# Patient Record
Sex: Female | Born: 1950 | Race: Black or African American | Hispanic: No | Marital: Single | Smoking: Former smoker
Health system: Southern US, Community
[De-identification: ages and names within clinical notes are randomized; demographics above are authoritative.]

## PROBLEM LIST (undated history)

## (undated) DIAGNOSIS — I2510 Atherosclerotic heart disease of native coronary artery without angina pectoris: Secondary | ICD-10-CM

## (undated) DIAGNOSIS — I10 Essential (primary) hypertension: Secondary | ICD-10-CM

## (undated) DIAGNOSIS — E119 Type 2 diabetes mellitus without complications: Secondary | ICD-10-CM

## (undated) DIAGNOSIS — E7800 Pure hypercholesterolemia, unspecified: Secondary | ICD-10-CM

## (undated) HISTORY — DX: Atherosclerotic heart disease of native coronary artery without angina pectoris: I25.10

## (undated) HISTORY — PX: CORONARY ANGIOPLASTY WITH STENT PLACEMENT: SHX49

## (undated) HISTORY — PX: ABDOMINAL HYSTERECTOMY: SHX81

---

## 2016-12-10 ENCOUNTER — Encounter (HOSPITAL_COMMUNITY): Payer: Self-pay | Admitting: Emergency Medicine

## 2016-12-10 ENCOUNTER — Ambulatory Visit (HOSPITAL_COMMUNITY): Payer: BLUE CROSS/BLUE SHIELD

## 2016-12-10 ENCOUNTER — Observation Stay (HOSPITAL_COMMUNITY)
Admission: EM | Admit: 2016-12-10 | Discharge: 2016-12-11 | Disposition: A | Discharge status: Home / Self Care | Payer: BLUE CROSS/BLUE SHIELD | Attending: Oncology | Admitting: Oncology

## 2016-12-10 ENCOUNTER — Ambulatory Visit (INDEPENDENT_AMBULATORY_CARE_PROVIDER_SITE_OTHER)
Admission: EM | Admit: 2016-12-10 | Discharge: 2016-12-10 | Disposition: A | Discharge status: Nursing Home (Includes ICF) | Payer: BLUE CROSS/BLUE SHIELD | Source: Home / Self Care | Attending: Family Medicine | Admitting: Family Medicine

## 2016-12-10 ENCOUNTER — Observation Stay (HOSPITAL_COMMUNITY): Payer: BLUE CROSS/BLUE SHIELD

## 2016-12-10 ENCOUNTER — Encounter (HOSPITAL_COMMUNITY): Payer: Self-pay | Admitting: *Deleted

## 2016-12-10 DIAGNOSIS — Z7982 Long term (current) use of aspirin: Secondary | ICD-10-CM | POA: Diagnosis not present

## 2016-12-10 DIAGNOSIS — E860 Dehydration: Secondary | ICD-10-CM | POA: Diagnosis not present

## 2016-12-10 DIAGNOSIS — I10 Essential (primary) hypertension: Secondary | ICD-10-CM | POA: Insufficient documentation

## 2016-12-10 DIAGNOSIS — R197 Diarrhea, unspecified: Secondary | ICD-10-CM

## 2016-12-10 DIAGNOSIS — E785 Hyperlipidemia, unspecified: Secondary | ICD-10-CM | POA: Diagnosis not present

## 2016-12-10 DIAGNOSIS — E7800 Pure hypercholesterolemia, unspecified: Secondary | ICD-10-CM | POA: Insufficient documentation

## 2016-12-10 DIAGNOSIS — I959 Hypotension, unspecified: Secondary | ICD-10-CM | POA: Diagnosis not present

## 2016-12-10 DIAGNOSIS — Z955 Presence of coronary angioplasty implant and graft: Secondary | ICD-10-CM | POA: Diagnosis not present

## 2016-12-10 DIAGNOSIS — E119 Type 2 diabetes mellitus without complications: Secondary | ICD-10-CM | POA: Insufficient documentation

## 2016-12-10 DIAGNOSIS — Z8269 Family history of other diseases of the musculoskeletal system and connective tissue: Secondary | ICD-10-CM

## 2016-12-10 DIAGNOSIS — J101 Influenza due to other identified influenza virus with other respiratory manifestations: Secondary | ICD-10-CM | POA: Diagnosis not present

## 2016-12-10 DIAGNOSIS — N179 Acute kidney failure, unspecified: Secondary | ICD-10-CM | POA: Diagnosis not present

## 2016-12-10 DIAGNOSIS — R112 Nausea with vomiting, unspecified: Secondary | ICD-10-CM

## 2016-12-10 DIAGNOSIS — Z79899 Other long term (current) drug therapy: Secondary | ICD-10-CM | POA: Insufficient documentation

## 2016-12-10 DIAGNOSIS — Z8249 Family history of ischemic heart disease and other diseases of the circulatory system: Secondary | ICD-10-CM | POA: Diagnosis not present

## 2016-12-10 DIAGNOSIS — I252 Old myocardial infarction: Secondary | ICD-10-CM | POA: Insufficient documentation

## 2016-12-10 DIAGNOSIS — N289 Disorder of kidney and ureter, unspecified: Secondary | ICD-10-CM | POA: Diagnosis not present

## 2016-12-10 DIAGNOSIS — R55 Syncope and collapse: Secondary | ICD-10-CM

## 2016-12-10 DIAGNOSIS — I2510 Atherosclerotic heart disease of native coronary artery without angina pectoris: Secondary | ICD-10-CM | POA: Diagnosis not present

## 2016-12-10 DIAGNOSIS — Z7984 Long term (current) use of oral hypoglycemic drugs: Secondary | ICD-10-CM

## 2016-12-10 HISTORY — DX: Type 2 diabetes mellitus without complications: E11.9

## 2016-12-10 HISTORY — DX: Essential (primary) hypertension: I10

## 2016-12-10 HISTORY — DX: Pure hypercholesterolemia, unspecified: E78.00

## 2016-12-10 LAB — URINALYSIS, ROUTINE W REFLEX MICROSCOPIC
Bilirubin Urine: NEGATIVE
GLUCOSE, UA: NEGATIVE mg/dL
Ketones, ur: NEGATIVE mg/dL
Nitrite: NEGATIVE
PROTEIN: 30 mg/dL — AB
Specific Gravity, Urine: 1.018 (ref 1.005–1.030)
pH: 5 (ref 5.0–8.0)

## 2016-12-10 LAB — CBC WITH DIFFERENTIAL/PLATELET
Basophils Absolute: 0 10*3/uL (ref 0.0–0.1)
Basophils Relative: 0 %
Eosinophils Absolute: 0.1 10*3/uL (ref 0.0–0.7)
Eosinophils Relative: 2 %
HEMATOCRIT: 39.6 % (ref 36.0–46.0)
HEMOGLOBIN: 12.6 g/dL (ref 12.0–15.0)
LYMPHS ABS: 2.3 10*3/uL (ref 0.7–4.0)
LYMPHS PCT: 26 %
MCH: 24 pg — AB (ref 26.0–34.0)
MCHC: 31.8 g/dL (ref 30.0–36.0)
MCV: 75.4 fL — AB (ref 78.0–100.0)
MONOS PCT: 11 %
Monocytes Absolute: 1 10*3/uL (ref 0.1–1.0)
NEUTROS ABS: 5.6 10*3/uL (ref 1.7–7.7)
Neutrophils Relative %: 61 %
Platelets: 279 10*3/uL (ref 150–400)
RBC: 5.25 MIL/uL — ABNORMAL HIGH (ref 3.87–5.11)
RDW: 17 % — ABNORMAL HIGH (ref 11.5–15.5)
WBC: 9 10*3/uL (ref 4.0–10.5)

## 2016-12-10 LAB — I-STAT CG4 LACTIC ACID, ED
LACTIC ACID, VENOUS: 1.95 mmol/L — AB (ref 0.5–1.9)
Lactic Acid, Venous: 2.57 mmol/L (ref 0.5–1.9)

## 2016-12-10 LAB — I-STAT CHEM 8, ED
BUN: 39 mg/dL — AB (ref 6–20)
CREATININE: 3.4 mg/dL — AB (ref 0.44–1.00)
Calcium, Ion: 1.11 mmol/L — ABNORMAL LOW (ref 1.15–1.40)
Chloride: 105 mmol/L (ref 101–111)
GLUCOSE: 94 mg/dL (ref 65–99)
HEMATOCRIT: 41 % (ref 36.0–46.0)
Hemoglobin: 13.9 g/dL (ref 12.0–15.0)
Potassium: 3.8 mmol/L (ref 3.5–5.1)
Sodium: 141 mmol/L (ref 135–145)
TCO2: 20 mmol/L (ref 0–100)

## 2016-12-10 LAB — GLUCOSE, CAPILLARY
GLUCOSE-CAPILLARY: 91 mg/dL (ref 65–99)
Glucose-Capillary: 114 mg/dL — ABNORMAL HIGH (ref 65–99)
Glucose-Capillary: 89 mg/dL (ref 65–99)

## 2016-12-10 LAB — I-STAT TROPONIN, ED: TROPONIN I, POC: 0.01 ng/mL (ref 0.00–0.08)

## 2016-12-10 LAB — HEPATIC FUNCTION PANEL
ALT: 12 U/L — ABNORMAL LOW (ref 14–54)
AST: 23 U/L (ref 15–41)
Albumin: 3.3 g/dL — ABNORMAL LOW (ref 3.5–5.0)
Alkaline Phosphatase: 52 U/L (ref 38–126)
BILIRUBIN TOTAL: 0.3 mg/dL (ref 0.3–1.2)
Total Protein: 7 g/dL (ref 6.5–8.1)

## 2016-12-10 LAB — D-DIMER, QUANTITATIVE (NOT AT ARMC): D DIMER QUANT: 0.47 ug{FEU}/mL (ref 0.00–0.50)

## 2016-12-10 LAB — INFLUENZA PANEL BY PCR (TYPE A & B)
Influenza A By PCR: POSITIVE — AB
Influenza B By PCR: NEGATIVE

## 2016-12-10 MED ORDER — ONDANSETRON 4 MG PO TBDP: 4.0000 mg | ORAL_TABLET | Freq: Once | ORAL | Status: DC

## 2016-12-10 MED ORDER — SODIUM CHLORIDE 0.9 % IV BOLUS (SEPSIS)
1000.0000 mL | Freq: Once | INTRAVENOUS | Status: AC
  Administered 2016-12-10: 1000 mL via INTRAVENOUS

## 2016-12-10 MED ORDER — POTASSIUM CHLORIDE IN NACL 20-0.9 MEQ/L-% IV SOLN
INTRAVENOUS | Status: AC
  Administered 2016-12-10 – 2016-12-11 (×2): via INTRAVENOUS
  Filled 2016-12-10 (×2): qty 1000

## 2016-12-10 MED ORDER — ROSUVASTATIN CALCIUM 40 MG PO TABS
40.0000 mg | ORAL_TABLET | Freq: Every day | ORAL | Status: DC
  Administered 2016-12-11: 40 mg via ORAL
  Filled 2016-12-10: qty 2
  Filled 2016-12-10: qty 1

## 2016-12-10 MED ORDER — ISOSORBIDE DINITRATE 30 MG PO TABS: 30.0000 mg | ORAL_TABLET | Freq: Every day | ORAL | Status: DC

## 2016-12-10 MED ORDER — ONDANSETRON HCL 4 MG/2ML IJ SOLN
INTRAMUSCULAR | Status: AC
  Filled 2016-12-10: qty 2

## 2016-12-10 MED ORDER — OSELTAMIVIR PHOSPHATE 30 MG PO CAPS
30.0000 mg | ORAL_CAPSULE | Freq: Every day | ORAL | Status: DC
  Administered 2016-12-10: 30 mg via ORAL
  Filled 2016-12-10: qty 1

## 2016-12-10 MED ORDER — SODIUM CHLORIDE 0.9 % IV BOLUS (SEPSIS): 1000.0000 mL | Freq: Once | INTRAVENOUS | Status: DC

## 2016-12-10 MED ORDER — HEPARIN SODIUM (PORCINE) 5000 UNIT/ML IJ SOLN
5000.0000 [IU] | Freq: Three times a day (TID) | INTRAMUSCULAR | Status: DC
  Administered 2016-12-10 – 2016-12-11 (×2): 5000 [IU] via SUBCUTANEOUS
  Filled 2016-12-10 (×2): qty 1

## 2016-12-10 MED ORDER — ASPIRIN 81 MG PO CHEW
81.0000 mg | CHEWABLE_TABLET | Freq: Every day | ORAL | Status: DC
  Administered 2016-12-11: 81 mg via ORAL
  Filled 2016-12-10: qty 1

## 2016-12-10 MED ORDER — INSULIN ASPART 100 UNIT/ML ~~LOC~~ SOLN: 0.0000 [IU] | Freq: Three times a day (TID) | SUBCUTANEOUS | Status: DC

## 2016-12-10 MED ORDER — ONDANSETRON HCL 4 MG/2ML IJ SOLN
4.0000 mg | Freq: Once | INTRAMUSCULAR | Status: AC
  Administered 2016-12-10: 4 mg via INTRAMUSCULAR

## 2016-12-10 NOTE — ED Triage Notes (Signed)
[facility] with a complaint of cough and chills x 2 days.

## 2016-12-10 NOTE — H&P (Signed)
Date: 12/10/2016               Patient Name:  Carrie Stevenson MRN: [id number]  DOB: 02-15-1951 Age / Sex: 66 y.o., female   PCP: No Pcp Per Patient         Medical Service: Internal Medicine Teaching Service         Attending Physician: Dr. Marily Memos, MD    First Contact: Dr. Reymundo Poll Pager: [phone]  Second Contact: Dr. Valentino Nose Pager: [phone]       After Hours (After 5p/  First Contact Pager: [phone]  weekends / holidays): Second Contact Pager: [phone]   Chief Complaint: Diarrhea   History of Present Illness: Patient is a 66 yo F with pmhx of HTN, DM, and CAD s/p stent placement (8 years ago) who presents with complaints of cough and diarrhea x 3 days. Patient was in her usual state of health until Saturday when she developed URI symptoms of cough and congestion. On Sunday, patient developed diarrhea, body aches, and poor appetite. She reports 2-3 episodes of watery diarrhea a day and fevers up to 102 at home. She denies sick contacts, but did just return from a trip to [place]. Her return flight home was Friday, the day before she began to feel ill. She denies eating any usual or uncooked foods while on her trip. She initially presented to an urgent care. While in triage, she suddenly became weak and had a witnessed syncopal episode. SBP during this time was recorded as 50 and she was noted to have AMS. This episode was brief and she returned to baseline quickly. She subsequently had a witnessed episode of emesis and was transferred to [facility] ED for further evaluation.   On arrival to [facility], she was afebrile T 97.5 but hypotensive 81/59 with HR 98 and RR 20. She was given 2L NS with good response in her BP to 124/80. CBC was unremarkable. BMP was notable for elevated creatinine of 3.4 (no priors for comparison). Lactic acid was elevated 2.57. CXR was ordered and pending.   Meds:  Current Meds  Medication Sig  . aspirin 81 MG chewable tablet Chew 81 mg by mouth  daily.   Marland Kitchen guaiFENesin (MUCINEX) 600 MG 12 hr tablet Take 600 mg by mouth 2 (two) times daily as needed for cough.  . isosorbide dinitrate (ISORDIL) 30 MG tablet Take 30 mg by mouth daily.   Marland Kitchen lisinopril (PRINIVIL,ZESTRIL) 10 MG tablet Take 10 mg by mouth daily.  . metFORMIN (GLUCOPHAGE) 1000 MG tablet Take 1,000 mg by mouth 2 (two) times daily with a meal.  . rosuvastatin (CRESTOR) 40 MG tablet Take 40 mg by mouth daily.     Allergies: Allergies as of 12/10/2016  . (No Known Allergies)   Past Medical History:  Diagnosis Date  . Diabetes mellitus without complication (HCC)   . Hypercholesteremia   . Hypertension     Family History: CAD in all three sisters. ?Cancer in sister and grandmother. Lupus in mother, 2 sisters, and brother.   Social History: Recently moved back to [place] from [place]. Denies tobacco, alcohol, and illicit drug use.   Review of Systems: A complete ROS was negative except as per HPI.   Physical Exam: Blood pressure 124/80, pulse 92, temperature 97.8 F (36.6 C), temperature source Oral, resp. rate 21, SpO2 96 %. Constitutional: Laying in bed under multiple blankets, appears comfortable HEENT: Atraumatic, normocephalic. PERRL, anicteric sclera.  Neck: Supple, trachea midline.  Cardiovascular: Distant heart sounds but RRR, no murmurs, rubs, or gallops.  Pulmonary/Chest: CTAB, Mild expiratory wheezing, rales, or rhonchi. No chest wall abnormalities.  Abdominal: Soft, non tender, non distended. +BS.  Extremities: Warm and well perfused. Distal pulses intact. No edema.  Neurological: A&Ox3, CN II - XII grossly intact.  Skin: No rashes or erythema  Psychiatric: Normal mood and affect  EKG: Personally reviewed. Normal sinus rhythm. Q waves in II, III, aVF, V4, V5, V6  CXR: Pending   Assessment & Plan by Problem:  Patient is a 66 yo F with pmhx of HTN, DM, and CAD s/p stent (8 years ago) who presents with symptoms of URI and diarrhea x 3 days, admitted  after a syncopal episode and found to be hypotensive in the ED.   Syncope: Likely due to dehydration from a viral illness. Presentation is concerning for influenza vs. Gastroenteritis. Patient was hypotensive in the ED and BP responded well to fluids. She reports diarrhea x 3 days, very poor po intake, and one episode of witnessed emesis in the ED. She first developed symptoms of cough and congestion on Saturday, the day after [place]. She subsequently developed body aches, fevers up to 102, and watery diarrhea.  -- Supportive care -- S/p 2 L NS in ED; continue NS @ 125 -- Diet as tolerated -- Influenza panel pending  -- Droplet precautions   Renal Dysfuntion: Creatinine 3.4, no prior labs for comparison. Unclear baseline or chronicity, but suspect some component of acute on chronic due to viral illness and poor po intake. -- NS @ 125  -- Recheck AM labs    DM: -- Hold home metformin -- SSI   HTN:  -- Hold home lisinopril, isosorbide, carvedilol   HLD: -- Continue Crestor 40 mg daily   CAD: -- Continue ASA 81 daily   FEN: NS @ 125 cc/hr, replete lytes prn, Diet as tolerated VTE ppx: Sub q heparin  Code Status: FULL   Dispo: Admit patient to Observation with expected length of stay less than 2 midnights.  Signed: Reymundo Pollarolyn Sandeep Delagarza, MD 12/10/2016, 3:21 PM  Pager: [phone]

## 2016-12-10 NOTE — Progress Notes (Signed)
Carrie Stevenson [id number] Admission Data: 12/10/2016 5:41 PM Attending Provider: Levert FeinsteinJames M Granfortuna, MD  PCP:No PCP Per Patient Consults/ Treatment Team:   Carrie Stevenson is a 66 y.o. female patient admitted from ED awake, alert  & orientated  X 3,  Full Code, VSS - Blood pressure (!) 150/80, pulse 94, temperature 98.3 F (36.8 C), temperature source Oral, resp. rate 17, SpO2 94 %., , no c/o shortness of breath, no c/o chest pain, no distress noted.   IV site WDL:  hand right, condition patent and no redness with a transparent dsg that's clean dry and intact.  Allergies:  No Known Allergies   Past Medical History:  Diagnosis Date  . Diabetes mellitus without complication (HCC)   . Hypercholesteremia   . Hypertension     History:  obtained from chart review. Tobacco/alcohol: denied none  Pt orientation to unit, room and routine. Information packet given to patient/family and safety video watched.  Admission INP armband ID verified with patient/family, and in place. SR up x 2, fall risk assessment complete with Patient and family verbalizing understanding of risks associated with falls. Pt verbalizes an understanding of how to use the call bell and to call for help before getting out of bed.  Skin, clean-dry- intact without evidence of bruising, or skin tears.   No evidence of skin break down noted on exam. color normal, vascularity normal, no rashes or suspicious lesions, no evidence of bleeding or bruising, no lesions noted, no rash, no edema, temperature normal, texture normal, mobility and turgor normal, nails normal without clubbing    Will cont to monitor and assist as needed.  Camillo FlamingVicki L Khy Pitre, RN 12/10/2016 5:41 PM

## 2016-12-10 NOTE — ED Triage Notes (Signed)
Per [facility] [place] [place]. Pt reports generalized weakness, diarrhea, and productive cough. Pt was found to be hypotensive with EMS. Pt received 500ml NS en route

## 2016-12-10 NOTE — ED Notes (Signed)
Transferred to stretcher, transferred without incident.  Prior to transfer, patient vomited small/moderate dark watery liquid.  Patient diaphoretic.

## 2016-12-10 NOTE — ED Notes (Signed)
Patient acuity reported to T. Bast, RN. 

## 2016-12-10 NOTE — ED Provider Notes (Signed)
CSN: [id number]     Arrival date & time 12/10/16  1141 History   None    Chief Complaint  Patient presents with  . Cough   (Consider location/radiation/quality/duration/timing/severity/associated sxs/prior Treatment) 66 year old female patient brought to clinic by her husband with a chief complaint of nausea, vomiting, and diarrhea for 2-3 days has not had anything to eat in 2-3 days due to her symptoms she is a diabetic and taking metformin. She reports she has vomited 2-3 times this morning and also had 4-5 loose stools. She complains of weakness but denies chest pain or shortness of breath. She has had no swelling in her hands feet or ankles, she denies any heart palpitations, she does have a past history of heart problems having a stent placed several years ago.  For family history, she reports her mother died at age 64 of lupus, she reports her father died at the age of 64-32 of an accident. Multiple family members have history of diabetes, hypertension, high cholesterol, and stroke. She reports she had one sister died at the age of 64 due to heart attack.  Patient became weak in triage, had syncopal episode witnessed by nursing staff. Nursing staff reports she had systolic blood pressure of 50, along with altered mental status. Patient was actively vomiting in triage, vomiting. Clear without bile, or evidence of hemorrhage.   The history is provided by the spouse.  Cough    Past Medical History:  Diagnosis Date  . Diabetes mellitus without complication (HCC)   . Hypercholesteremia   . Hypertension    Past Surgical History:  Procedure Laterality Date  . ABDOMINAL HYSTERECTOMY    . CORONARY ANGIOPLASTY WITH STENT PLACEMENT     History reviewed. No pertinent family history. Social History  Substance Use Topics  . Smoking status: Never Smoker  . Smokeless tobacco: Never Used  . Alcohol use No   OB History    No data available     Review of Systems  Reason unable to perform  ROS: as covered in HPI.  Respiratory: Positive for cough.   All other systems reviewed and are negative.   Allergies  Patient has no known allergies.  Home Medications   Prior to Admission medications   Medication Sig Start Date End Date Taking? Authorizing Provider  aspirin 81 MG chewable tablet Chew by mouth daily.   Yes Historical Provider, MD  isosorbide dinitrate (ISORDIL) 30 MG tablet Take 30 mg by mouth 4 (four) times daily.   Yes Historical Provider, MD  lisinopril (PRINIVIL,ZESTRIL) 10 MG tablet Take 10 mg by mouth daily.   Yes Historical Provider, MD  rosuvastatin (CRESTOR) 40 MG tablet Take 40 mg by mouth daily.   Yes Historical Provider, MD   Meds Ordered and Administered this Visit   Medications  sodium chloride 0.9 % bolus 1,000 mL (1,000 mLs Intravenous Given 12/10/16 1308)  ondansetron (ZOFRAN) injection 4 mg (4 mg Intramuscular Given 12/10/16 1309)    BP (!) 79/52   Pulse 92   Temp 97.5 F (36.4 C) (Oral)   Resp 18   LMP  (LMP Unknown)   SpO2 97%  No data found.   Physical Exam  Constitutional: She is oriented to person, place, and time. She appears well-developed and well-nourished. She appears lethargic. She has a sickly appearance. She appears ill. She appears distressed.  HENT:  Head: Normocephalic.  Right Ear: External ear normal.  Left Ear: External ear normal.  Eyes: EOM are normal. Pupils are equal, round,  and reactive to light.  Neck: Normal range of motion. No JVD present.  Cardiovascular: Normal rate, regular rhythm, S1 normal, S2 normal, normal heart sounds, intact distal pulses and normal pulses.   Pulmonary/Chest: Effort normal. She has decreased breath sounds in the left lower field. She has rhonchi in the right lower field and the left lower field.  Abdominal: Soft. Bowel sounds are normal.  Lymphadenopathy:       Head (right side): No submental, no submandibular and no tonsillar adenopathy present.       Head (left side): No submental, no  submandibular and no tonsillar adenopathy present.    She has no cervical adenopathy.  Neurological: She is oriented to person, place, and time. She appears lethargic.  Skin: Skin is warm. Capillary refill takes less than 2 seconds. She is diaphoretic. There is pallor.  Psychiatric: She has a normal mood and affect.  Nursing note and vitals reviewed.   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   (including critical care time)  Labs Review Labs Reviewed  GLUCOSE, CAPILLARY - Abnormal; Notable for the following:       Result Value   Glucose-Capillary 114 (*)    All other components within normal limits    Imaging Review No results found.   Visual Acuity Review  Right Eye Distance:   Left Eye Distance:   Bilateral Distance:    Right Eye Near:   Left Eye Near:    Bilateral Near:         MDM   1. Syncope and collapse   2. Hypotension, unspecified hypotension type   3. Nausea vomiting and diarrhea    [facility] for syncopal episode with hypotension. Patient has IV established, had 4 mg of Zofran IV push, 1 L normal saline, cardiac monitoring, and 12-lead EKG showing no acute changes, however evidence of past lateral MI.      Dorena Bodo, NP 12/10/16 1339

## 2016-12-10 NOTE — ED Provider Notes (Signed)
[facility]-EMERGENCY DEPT Provider Note   CSN: [id number] Arrival date & time: 12/10/16  1338     History   Chief Complaint Chief Complaint  Patient presents with  . Hypotension    HPI Carrie Stevenson is a 66 y.o. female.  HPI  Sent from clinic after syncopal episode - went for myalgias, N/V/D, cough, sore throat While sitting, syncopized Per EMS, had large blood cuff so perhaps not so hypotensive No cp/sob Has had an mi in past, with stent - however, sx not like this Was recently in a long trip but no hx dvt/pe, recent surgery States feels much better now - got 1L fluids No rectal bleeding, hematemesis, coffee ground emesis No vaginal bleeding, not pregnant or able to be pregnant  Past Medical History:  Diagnosis Date  . Diabetes mellitus without complication (HCC)   . Hypercholesteremia   . Hypertension     Patient Active Problem List   Diagnosis Date Noted  . Diabetes mellitus without complication (HCC) 12/10/2016  . Hypertension 12/10/2016  . Flu-like symptoms 12/10/2016  . AKI (acute kidney injury) (HCC) 12/10/2016    Past Surgical History:  Procedure Laterality Date  . ABDOMINAL HYSTERECTOMY    . CORONARY ANGIOPLASTY WITH STENT PLACEMENT      OB History    No data available       Home Medications    Prior to Admission medications   Medication Sig Start Date End Date Taking? Authorizing Provider  aspirin 81 MG chewable tablet Chew 81 mg by mouth daily.    Yes Historical Provider, MD  guaiFENesin (MUCINEX) 600 MG 12 hr tablet Take 600 mg by mouth 2 (two) times daily as needed for cough.   Yes Historical Provider, MD  isosorbide dinitrate (ISORDIL) 30 MG tablet Take 30 mg by mouth daily.    Yes Historical Provider, MD  lisinopril (PRINIVIL,ZESTRIL) 10 MG tablet Take 10 mg by mouth daily.   Yes Historical Provider, MD  metFORMIN (GLUCOPHAGE) 1000 MG tablet Take 1,000 mg by mouth 2 (two) times daily with a meal.   Yes Historical Provider, MD  rosuvastatin  (CRESTOR) 40 MG tablet Take 40 mg by mouth daily.   Yes Historical Provider, MD    Family History No family history on file.  Social History Social History  Substance Use Topics  . Smoking status: Never Smoker  . Smokeless tobacco: Never Used  . Alcohol use No     Allergies   Patient has no known allergies.   Review of Systems Review of Systems  Constitutional: Positive for chills and fatigue.  Allergic/Immunologic: Negative for immunocompromised state.  All other systems reviewed and are negative.    Physical Exam Updated Vital Signs BP (!) 150/80 (BP Location: Right Arm)   Pulse 94   Temp 98.3 F (36.8 C) (Oral)   Resp 17   LMP  (LMP Unknown)   SpO2 94%   Physical Exam  Constitutional: She appears well-developed and well-nourished. No distress.  HENT:  Head: Normocephalic and atraumatic.  Eyes: Conjunctivae are normal.  Neck: Neck supple.  Cardiovascular: Normal rate and regular rhythm.   No murmur heard. Pulmonary/Chest: Effort normal and breath sounds normal. No respiratory distress.  Abdominal: Soft. Bowel sounds are normal. She exhibits no distension and no mass. There is no tenderness. There is no rebound and no guarding. No hernia.  Musculoskeletal: She exhibits no edema.  Neurological: She is alert.  Skin: Skin is warm and dry.  Psychiatric: She has a normal mood and  affect.  Nursing note and vitals reviewed.    ED Treatments / Results  Labs (all labs ordered are listed, but only abnormal results are displayed) Labs Reviewed  CBC WITH DIFFERENTIAL/PLATELET - Abnormal; Notable for the following:       Result Value   RBC 5.25 (*)    MCV 75.4 (*)    MCH 24.0 (*)    RDW 17.0 (*)    All other components within normal limits  HEPATIC FUNCTION PANEL - Abnormal; Notable for the following:    Albumin 3.3 (*)    ALT 12 (*)    Bilirubin, Direct <0.1 (*)    All other components within normal limits  I-STAT CG4 LACTIC ACID, ED - Abnormal; Notable for  the following:    Lactic Acid, Venous 2.57 (*)    All other components within normal limits  I-STAT CHEM 8, ED - Abnormal; Notable for the following:    BUN 39 (*)    Creatinine, Ser 3.40 (*)    Calcium, Ion 1.11 (*)    All other components within normal limits  I-STAT CG4 LACTIC ACID, ED - Abnormal; Notable for the following:    Lactic Acid, Venous 1.95 (*)    All other components within normal limits  D-DIMER, QUANTITATIVE (NOT AT [facility])  URINALYSIS, ROUTINE W REFLEX MICROSCOPIC  INFLUENZA PANEL BY PCR (TYPE A & B)  HIV ANTIBODY (ROUTINE TESTING)  BASIC METABOLIC PANEL  GLUCOSE, CAPILLARY  I-STAT TROPOININ, ED  I-STAT CG4 LACTIC ACID, ED    EKG  EKG Interpretation  Date/Time:  Tuesday December 10 2016 14:15:03 EST Ventricular Rate:  86 PR Interval:  150 QRS Duration: 84 QT Interval:  402 QTC Calculation: 481 R Axis:   -8 Text Interpretation:  Normal sinus rhythm Inferior infarct , age undetermined Anterolateral infarct , age undetermined Abnormal ECG No old tracing to compare Confirmed by [facility] MD, Barbara CowerJASON [phone]([id number]) on 12/10/2016 2:58:39 PM       Radiology No results found.  Procedures Procedures (including critical care time)  Medications Ordered in ED Medications  rosuvastatin (CRESTOR) tablet 40 mg (not administered)  aspirin chewable tablet 81 mg (not administered)  heparin injection 5,000 Units (not administered)  0.9 % NaCl with KCl 20 mEq/ L  infusion (not administered)  insulin aspart (novoLOG) injection 0-9 Units (not administered)  sodium chloride 0.9 % bolus 1,000 mL (0 mLs Intravenous Stopped 12/10/16 1611)  sodium chloride 0.9 % bolus 1,000 mL (0 mLs Intravenous Stopped 12/10/16 1612)     Initial Impression / Assessment and Plan / ED Course  I have reviewed the triage vital signs and the nursing notes.  Pertinent labs & imaging results that were available during my care of the patient were reviewed by me and considered in my medical decision making  (see chart for details).     New AKI suspected, dehydration - fluids given, decreasing LA Doubt PE, low risk, d-dimer negative EKG with qw - not likely ACS - hx old MI but no EKG to compare to; trop negative Labs otherwise reassuring - suspect viral etiology, abdomen benign - doubt acute abdomen, AAA Pending influenza test Pt updated and in agreement Discussed with inpt team who agrees to admit   Final Clinical Impressions(s) / ED Diagnoses   Final diagnoses:  AKI (acute kidney injury) [facility](HCC)    New Prescriptions Current Discharge Medication List       Sidney AceAlison Charruf Undray Allman, MD 12/10/16 1713    Marily MemosJason Mesner, MD 12/11/16 1157

## 2016-12-10 NOTE — ED Notes (Signed)
spoke to justin, rn in [facility]

## 2016-12-10 NOTE — ED Notes (Signed)
[facility] notified 

## 2016-12-10 NOTE — ED Notes (Addendum)
[facility] at bedside. Report given to ED.

## 2016-12-10 NOTE — Discharge Instructions (Signed)
[facility] for syncopal episode with hypotension. Patient has IV established, had 4 mg of Zofran IV push, 1 L normal saline, cardiac monitoring, and 12-lead EKG showing no acute changes, however evidence of past lateral MI.

## 2016-12-10 NOTE — ED Notes (Signed)
Went to recheck patients BP and checked twice. Pt BP was 51/60. Pt was pale, diaphoretic and cool and clammy. Pt vomited twice. Pt taken to a bed and IV and fluids started. Pt never lost consciousness. Pt currently alert with family at bedside. [facility] called for transport to ED>

## 2016-12-11 ENCOUNTER — Telehealth: Payer: Self-pay | Admitting: Internal Medicine

## 2016-12-11 DIAGNOSIS — N179 Acute kidney failure, unspecified: Secondary | ICD-10-CM | POA: Diagnosis not present

## 2016-12-11 DIAGNOSIS — J101 Influenza due to other identified influenza virus with other respiratory manifestations: Secondary | ICD-10-CM | POA: Diagnosis not present

## 2016-12-11 DIAGNOSIS — B9789 Other viral agents as the cause of diseases classified elsewhere: Secondary | ICD-10-CM

## 2016-12-11 LAB — BASIC METABOLIC PANEL
Anion gap: 10 (ref 5–15)
BUN: 23 mg/dL — ABNORMAL HIGH (ref 6–20)
CALCIUM: 8.7 mg/dL — AB (ref 8.9–10.3)
CO2: 21 mmol/L — ABNORMAL LOW (ref 22–32)
CREATININE: 1.57 mg/dL — AB (ref 0.44–1.00)
Chloride: 108 mmol/L (ref 101–111)
GFR calc Af Amer: 39 mL/min — ABNORMAL LOW (ref >60)
GFR, EST NON AFRICAN AMERICAN: 34 mL/min — AB (ref >60)
GLUCOSE: 88 mg/dL (ref 65–99)
Potassium: 3.9 mmol/L (ref 3.5–5.1)
SODIUM: 139 mmol/L (ref 135–145)

## 2016-12-11 LAB — GLUCOSE, CAPILLARY: GLUCOSE-CAPILLARY: 91 mg/dL (ref 65–99)

## 2016-12-11 LAB — HIV ANTIBODY (ROUTINE TESTING W REFLEX): HIV SCREEN 4TH GENERATION: NONREACTIVE

## 2016-12-11 MED ORDER — OSELTAMIVIR PHOSPHATE 30 MG PO CAPS
30.0000 mg | ORAL_CAPSULE | Freq: Two times a day (BID) | ORAL | Status: DC
  Administered 2016-12-11: 30 mg via ORAL
  Filled 2016-12-11: qty 1

## 2016-12-11 MED ORDER — OSELTAMIVIR PHOSPHATE 30 MG PO CAPS: 30.0000 mg | ORAL_CAPSULE | Freq: Two times a day (BID) | ORAL | 0 refills | Status: DC

## 2016-12-11 NOTE — Discharge Summary (Signed)
Name: Carrie Stevenson MRN: [id number] DOB: [birth year] 66 y.o. PCP: No Pcp Per Patient  Date of Admission: 12/10/2016  1:38 PM Date of Discharge: 12/11/2016 Attending Physician: Levert Feinstein, MD  Discharge Diagnosis: 1. Influenza A 2. AKI   Discharge Medications: Allergies as of 12/11/2016   No Known Allergies     Medication List    TAKE these medications   aspirin 81 MG chewable tablet Chew 81 mg by mouth daily.   guaiFENesin 600 MG 12 hr tablet Commonly known as:  MUCINEX Take 600 mg by mouth 2 (two) times daily as needed for cough.   isosorbide dinitrate 30 MG tablet Commonly known as:  ISORDIL Take 30 mg by mouth daily.   lisinopril 10 MG tablet Commonly known as:  PRINIVIL,ZESTRIL Take 10 mg by mouth daily.   metFORMIN 1000 MG tablet Commonly known as:  GLUCOPHAGE Take 1,000 mg by mouth 2 (two) times daily with a meal.   oseltamivir 30 MG capsule Commonly known as:  TAMIFLU Take 1 capsule (30 mg total) by mouth 2 (two) times daily.   rosuvastatin 40 MG tablet Commonly known as:  CRESTOR Take 40 mg by mouth daily.       Disposition and follow-up:   [name] was discharged from [facility] in Stable condition.  At the hospital follow up visit please address:  1.  Influenza A: Discharged with prescription to complete 5 day course of Tamiflu (renally dosed due to AKI). Last day 12/15/16.   2. AKI: Likely prerenal due to diarrhea and poor po intake on admission. Home lisinopril were held. Creatinine improved with fluids 3.4 -> 1.5. Please recheck BMP. Lisinopril restarted on discharge.   3.  Labs / imaging needed at time of follow-up: BMP  4.  Pending labs/ test needing follow-up: none   Follow-up Appointments:   Hospital Course by problem list:  1. Influenza A Infection: Patient is a 66 yo F with pmhx of HTN, DM, and CAD s/p stent placement (8 years ago) who presented with complaints of cough and diarrhea x 3 days.  Patient flew back from [facility] the Friday prior to admission and began to feel ill the following day. She endorsed symptoms of URI (cough and congestion). On Sunday, she developed body aches, diarrhea, and fevers up to 102 at home. She was unable to tolerate any PO due to poor appetite and she presented to urget care on Tuesday morning. While in triage, she had a witnessed syncopal episode and was noted to have AMS. Systolic blood pressure during this episode was recorded to be in the 50s. She recovered quickly back to her baseline but then had a witnessed episode of emesis. She was then transferred to [facility] for further evaluation. On presentation,  she was afebrile T 97.5 but hypotensive 81/59 with HR 98 and RR 20. She was given 2L NS with good response in her BP to 124/80. CBC was unremarkable. BMP was notable for elevated creatinine of 3.4 (no priors for comparison). Lactic acid was elevated 2.57. CXR was negative. Influenza PCR ultimately resulted positive for flu A. She was started on renally dosed Tamiflu (30 mg BID) due to her AKI and received NS @ 125 overnight. The following morning, her diarrhea had resolved and she was ambulating around the room without difficulty. She was tolerating some PO without emesis. Hypotension had resolved. She was discharged with a prescription to complete a 5 day course of Tamiflu.   2. Acute Kidney  Injury: Likely prerenal due to volume depletion. Patient reported poor po intake x 2-3 days and multiple episodes of diarrhea a day. Creatinine improved with IVFs, 3.4 ->1.5. Lisinopril was held on admission but restarted at discharge. Please recheck BMP at follow up.  Discharge Vitals:   BP 129/85   Pulse 95   Temp 98.7 F (37.1 C)   Resp 18   Ht 5\' 3"  (1.6 m)   Wt 190 lb 12.8 oz (86.5 kg)   LMP  (LMP Unknown)   SpO2 100%   BMI 33.80 kg/m   Pertinent Labs, Studies, and Procedures:    Ref. Range 12/10/2016 14:54 12/11/2016 07:17  Sodium Latest Ref Range: 135  - 145 mmol/L 141 139  Potassium Latest Ref Range: 3.5 - 5.1 mmol/L 3.8 3.9  Chloride Latest Ref Range: 101 - 111 mmol/L 105 108  CO2 Latest Ref Range: 22 - 32 mmol/L  21 (L)  Glucose Latest Ref Range: 65 - 99 mg/dL 94 88  BUN Latest Ref Range: 6 - 20 mg/dL 39 (H) 23 (H)  Creatinine Latest Ref Range: 0.44 - 1.00 mg/dL 2.723.40 (H) 5.361.57 (H)    Ref. Range 12/10/2016 16:54  Influenza A By PCR Latest Ref Range: NEGATIVE  POSITIVE (A)  Influenza B By PCR Latest Ref Range: NEGATIVE  NEGATIVE    Discharge Instructions: Discharge Instructions    Call MD for:  difficulty breathing, headache or visual disturbances    Complete by:  As directed    Call MD for:  extreme fatigue    Complete by:  As directed    Call MD for:  persistant dizziness or light-headedness    Complete by:  As directed    Call MD for:  persistant nausea and vomiting    Complete by:  As directed    Call MD for:  temperature >100.4    Complete by:  As directed    Diet - low sodium heart healthy    Complete by:  As directed    Discharge instructions    Complete by:  As directed    Carrie Stevenson, I am glad you are feeling better. It was a pleasure taking care of you! For your influenza infection, please continue to take tamiflu twice a day for the next 4 days. A prescription has been sent to your pharmacy. You have been scheduled to follow up in our clinic next Wednesday the 28th at 1:45pm for hospital follow up. You will need to have blood work done at that visit to check on your kidney function. Our clinic is located on the ground floor of this hospital in the east wing past the cafeteria. If you have any questions or concerns, call our clinic at [phone] or after hours call [phone] and ask for the internal medicine resident on call. Thank you!   Increase activity slowly    Complete by:  As directed       Signed: Reymundo Pollarolyn Soumya Colson, MD 12/11/2016, 11:00 AM   Pager: [phone]

## 2016-12-11 NOTE — Progress Notes (Signed)
Kerrie PleasureArcine Buchan to be D/C'd Home per MD order.  Discussed with the patient and all questions fully answered.  VSS, Skin clean, dry and intact without evidence of skin break down, no evidence of skin tears noted. IV catheter discontinued intact. Site without signs and symptoms of complications. Dressing and pressure applied.  An After Visit Summary was printed and given to the patient. Patient received prescription.  D/c education completed with patient/family including follow up instructions, medication list, d/c activities limitations if indicated, with other d/c instructions as indicated by MD - patient able to verbalize understanding, all questions fully answered.   Patient instructed to return to ED, call 911, or call MD for any changes in condition.   Patient escorted via WC, and D/C home via private auto.  Grayling Congressvan J Karmel Patricelli 12/11/2016 12:12 PM

## 2016-12-11 NOTE — Telephone Encounter (Signed)
Pt needs TOC discharge date 12/11/16 HFU 12/18/16

## 2016-12-11 NOTE — Progress Notes (Signed)
   Subjective: Patient is feeling better today. She is no longer having diarrhea. She is tolerating minimal PO and ambulating around the room without issue. Eager for discharge.   Objective:  Vital signs in last 24 hours: Vitals:   12/10/16 2001 12/10/16 2003 12/10/16 2004 12/10/16 2005  BP: 135/77 125/79 120/79 133/81  Pulse: 97 (!) 104 (!) 110 (!) 110  Resp: 19 19 19 19   Temp: 98.2 F (36.8 C)     TempSrc: Oral     SpO2: 97% 95% 95% 96%  Weight:      Height:       Physical Exam Constitutional: NAD, appears comfortable Cardiovascular: RRR, no murmurs, rubs, or gallops.  Pulmonary/Chest: CTAB, no wheezes, rales, or rhonchi.  Abdominal: Soft, non tender, non distended. +BS.  Extremities: Warm and well perfused. Distal pulses intact. No edema.  Neurological: A&Ox3, CN II - XII grossly intact.    Assessment/Plan:  Patient is a 66 yo F with pmhx of HTN, DM, and CAD s/p stent (8 years ago) who presents with symptoms of URI and diarrhea x 3 days, admitted after a syncopal episode and found to be hypotensive in the ED.   Influenza A Infection: Patient presented with symptoms of cough and diarrhea x 3 days. She was hypotensive with AKI on admission and had a witnessed syncopal episode in triage. She is s/p 2L fluid bolus in the ED and NS @ 125 over night. This morning her BP has significantly improved. She was started on renally dosed tamiflu. She denies any further episode of diarrhea.  -- Discharge today -- Tamiflu BID to complete a 5 day course   AKI: Significantly improved this morning after fluids. Creatinine 3.4 --> 1.5.  -- F/u IMC 1 week for BMP recheck   DM: -- Hold home metformin -- SSI   HTN:  -- Restart home lisinopril, isosorbide, carvedilol on discharge   HLD: -- Continue Crestor 40 mg daily   CAD: -- Continue ASA 81 daily   FEN: Fluid stopped, replete lytes prn, Diet as tolerated VTE ppx: Sub q heparin  Code Status: FULL   Dispo: Anticipated  discharge today.   Carrie Pollarolyn Sherolyn Trettin, MD 12/11/2016, 9:37 AM Pager: [phone]

## 2016-12-18 ENCOUNTER — Ambulatory Visit (INDEPENDENT_AMBULATORY_CARE_PROVIDER_SITE_OTHER): Payer: BLUE CROSS/BLUE SHIELD | Admitting: Pulmonary Disease

## 2016-12-18 ENCOUNTER — Ambulatory Visit: Payer: BLUE CROSS/BLUE SHIELD

## 2016-12-18 VITALS — BP 155/93 | HR 82 | Temp 98.1°F | Ht 63.0 in | Wt 196.2 lb

## 2016-12-18 DIAGNOSIS — Z5189 Encounter for other specified aftercare: Secondary | ICD-10-CM

## 2016-12-18 DIAGNOSIS — Z79899 Other long term (current) drug therapy: Secondary | ICD-10-CM | POA: Diagnosis not present

## 2016-12-18 DIAGNOSIS — E119 Type 2 diabetes mellitus without complications: Secondary | ICD-10-CM

## 2016-12-18 DIAGNOSIS — N179 Acute kidney failure, unspecified: Secondary | ICD-10-CM | POA: Diagnosis not present

## 2016-12-18 DIAGNOSIS — Z8709 Personal history of other diseases of the respiratory system: Secondary | ICD-10-CM

## 2016-12-18 DIAGNOSIS — Z7984 Long term (current) use of oral hypoglycemic drugs: Secondary | ICD-10-CM

## 2016-12-18 DIAGNOSIS — I10 Essential (primary) hypertension: Secondary | ICD-10-CM | POA: Diagnosis not present

## 2016-12-18 LAB — POCT GLYCOSYLATED HEMOGLOBIN (HGB A1C): Hemoglobin A1C: 6

## 2016-12-18 LAB — GLUCOSE, CAPILLARY: Glucose-Capillary: 112 mg/dL — ABNORMAL HIGH (ref 65–99)

## 2016-12-18 NOTE — Assessment & Plan Note (Signed)
Assessment: Doing better from her influenza and completed therapy.  Plan: Recheck BMP today

## 2016-12-18 NOTE — Assessment & Plan Note (Signed)
Hgb A1c 6%. Continue metformin 1000mg  BID. Recheck in 6 months

## 2016-12-18 NOTE — Assessment & Plan Note (Addendum)
BP above goal today at 152/85. Will see what BMP results are before changing her therapy.  ADDENDUM: Cr improved from previous. Increase lisinopril to 20mg  daily. Discussed with patient on phone.

## 2016-12-18 NOTE — Progress Notes (Signed)
   CC: AKI follow up  HPI:  Ms.Marzelle Gerald DexterRasberry is a 66 y.o. woman with history as noted below here for follow up of AKI  She was hospitalized 2/20 to 2/21 for influenza A and AKI. She was discharged with Tamiflu. She completed this. She feels better overall. She feels warm at night. No chills. No chest pain. No dyspnea. Some residual cough - productive of yellow-white sputum. She has clear rhinorrhea. No post nasal drip.   Past Medical History:  Diagnosis Date  . Diabetes mellitus without complication (HCC)   . Hypercholesteremia   . Hypertension     Review of Systems:   No diarrhea No dysuria No dizziness  Physical Exam:  Vitals:   12/18/16 1351 12/18/16 1425  BP: (!) 152/85 (!) 155/93  Pulse: 87 82  Temp: 98.1 F (36.7 C)   TempSrc: Oral   SpO2: 100%   Weight: 196 lb 3.2 oz (89 kg)   Height: 5\' 3"  (1.6 m)    General Apperance: NAD HEENT: Normocephalic, atraumatic, anicteric sclera Neck: Supple, trachea midline Lungs: Clear to auscultation bilaterally. No wheezes, rhonchi or rales. Breathing comfortably Heart: Regular rate and rhythm, no murmur/rub/gallop Abdomen: Soft, nontender, nondistended, no rebound/guarding Extremities: Warm and well perfused, no edema Skin: No rashes or lesions Neurologic: Alert and interactive. No gross deficits.  Assessment & Plan:   See Encounters Tab for problem based charting.  Patient discussed with Dr. Josem KaufmannKlima

## 2016-12-18 NOTE — Patient Instructions (Addendum)
Follow up in 6 weeks, or sooner as needed

## 2016-12-19 LAB — BMP8+ANION GAP
ANION GAP: 17 mmol/L (ref 10.0–18.0)
BUN/Creatinine Ratio: 13 (ref 12–28)
BUN: 14 mg/dL (ref 8–27)
CALCIUM: 9.1 mg/dL (ref 8.7–10.3)
CHLORIDE: 102 mmol/L (ref 96–106)
CO2: 25 mmol/L (ref 18–29)
Creatinine, Ser: 1.1 mg/dL — ABNORMAL HIGH (ref 0.57–1.00)
GFR calc Af Amer: 62 mL/min/{1.73_m2} (ref >59)
GFR calc non Af Amer: 54 mL/min/{1.73_m2} — ABNORMAL LOW (ref >59)
GLUCOSE: 105 mg/dL — AB (ref 65–99)
POTASSIUM: 4 mmol/L (ref 3.5–5.2)
Sodium: 144 mmol/L (ref 134–144)

## 2016-12-19 MED ORDER — LISINOPRIL 20 MG PO TABS: 20.0000 mg | ORAL_TABLET | Freq: Every day | ORAL | 1 refills | Status: DC

## 2016-12-19 NOTE — Addendum Note (Signed)
Addended by: Griffin BasilKRALL, Sumie Remsen T on: 12/19/2016 01:29 PM   Modules accepted: Orders

## 2016-12-19 NOTE — Progress Notes (Signed)
Case discussed with Dr. Isabella BowensKrall soon after the resident saw the patient. We reviewed the resident's history and exam and pertinent patient test results. I agree with the assessment, diagnosis, and plan of care documented in the resident's note.  BMP is improved without evidence of hyperkalemia.  I suspect Dr. Isabella BowensKrall will increase the dose of the lisinopril for the hypertension given her diabetes.

## 2016-12-25 NOTE — Telephone Encounter (Signed)
No answer, no vmail 

## 2017-01-09 NOTE — Telephone Encounter (Signed)
No answer

## 2017-01-15 NOTE — Telephone Encounter (Signed)
No answer

## 2017-04-25 ENCOUNTER — Ambulatory Visit (INDEPENDENT_AMBULATORY_CARE_PROVIDER_SITE_OTHER): Payer: BLUE CROSS/BLUE SHIELD | Admitting: Internal Medicine

## 2017-04-25 ENCOUNTER — Other Ambulatory Visit: Payer: Self-pay | Admitting: Internal Medicine

## 2017-04-25 VITALS — BP 107/64 | HR 81 | Temp 98.1°F | Ht 63.0 in | Wt 194.1 lb

## 2017-04-25 DIAGNOSIS — I10 Essential (primary) hypertension: Secondary | ICD-10-CM

## 2017-04-25 DIAGNOSIS — E119 Type 2 diabetes mellitus without complications: Secondary | ICD-10-CM

## 2017-04-25 DIAGNOSIS — Z87891 Personal history of nicotine dependence: Secondary | ICD-10-CM

## 2017-04-25 DIAGNOSIS — Z79899 Other long term (current) drug therapy: Secondary | ICD-10-CM

## 2017-04-25 DIAGNOSIS — Z7982 Long term (current) use of aspirin: Secondary | ICD-10-CM

## 2017-04-25 DIAGNOSIS — Z1231 Encounter for screening mammogram for malignant neoplasm of breast: Secondary | ICD-10-CM

## 2017-04-25 DIAGNOSIS — Z7984 Long term (current) use of oral hypoglycemic drugs: Secondary | ICD-10-CM | POA: Diagnosis not present

## 2017-04-25 DIAGNOSIS — I2510 Atherosclerotic heart disease of native coronary artery without angina pectoris: Secondary | ICD-10-CM

## 2017-04-25 DIAGNOSIS — Z955 Presence of coronary angioplasty implant and graft: Secondary | ICD-10-CM

## 2017-04-25 DIAGNOSIS — Z23 Encounter for immunization: Secondary | ICD-10-CM

## 2017-04-25 DIAGNOSIS — Z0000 Encounter for general adult medical examination without abnormal findings: Secondary | ICD-10-CM

## 2017-04-25 HISTORY — DX: Atherosclerotic heart disease of native coronary artery without angina pectoris: I25.10

## 2017-04-25 NOTE — Assessment & Plan Note (Addendum)
The patient's BP is below goal of 140/90 today. At previous visits, she was above goal and the patient reported increasing her lisinopril after the last visit per physician request. She is currently taking amlodipine 10 mg daily and  lisinopril 20 mg, BID. She was instructed to continue taking these medications as previously instructed and to follow up in 6 months for management of this chronic medical condition.

## 2017-04-25 NOTE — Progress Notes (Signed)
   CC: Diabetes and Hypertension Follow Up  HPI:  [name] is a 66 y.o. with a PMH of HTN and DM who presents today for followup of these chronic medical problems and to establish care with the clinic. The patient feels well today and has no acute complains. She brought her medications to clinic today and reports that she is able to take all of these medications without complication. She reports eating a health diet, with a variety of fruits and vegetables. She states that she is trying to loose weight and has recently cut back on eating meat. She exercises by walking 30 minutes per day. She is currently in the process of moving to [place] but works and lives part time in [place].   Past Medical History:  Diagnosis Date  . CAD (coronary artery disease), native coronary artery 04/25/2017   Hx of stent placement in [place].  . Diabetes mellitus without complication (HCC)   . Hypercholesteremia   . Hypertension    Review of Systems:   Patient denies SOB, chest pain at rest or with exertion, wheezing, dizziness, headaches, changes in vision, swelling of lower extremities, weakness, numbness, difficulties with speech, fevers, and night sweats.   Physical Exam:  Vitals:   04/25/17 1001  BP: 107/64  Pulse: 81  Temp: 98.1 F (36.7 C)  TempSrc: Oral  SpO2: 100%  Weight: 194 lb 1.6 oz (88 kg)  Height: 5\' 3"  (1.6 m)   Physical Exam  Cardiovascular: Normal rate, regular rhythm, normal heart sounds and intact distal pulses.   No murmur heard. Pulmonary/Chest: Effort normal and breath sounds normal. No respiratory distress. She has no wheezes.  Abdominal: Soft. She exhibits no distension. There is no tenderness.  Musculoskeletal: She exhibits no edema.  Skin: Skin is warm and dry. Capillary refill takes less than 2 seconds. No erythema.    Assessment & Plan:   See Encounters Tab for problem based charting.  Patient seen with Dr. Cleda DaubE. Hoffman.

## 2017-04-25 NOTE — Patient Instructions (Addendum)
Thank you for coming to see us today.  You are doing great with your hypertension, diabetes, and heart disease medications and diet/lifestyle modifications! Please continue taking all medications as currently prescribed. Continue to exercise 30 minutes per day and eat a diet low in salt and sugar. Keep up the great work!  You were referred for a mammogram and to an optometrist today. You can complete these visits before you return to clinic in 6 months.   Please obtain records from previous colonoscopy and other medical procedures to help establish care.   Return to clinic in 6 months with Dr. Jeanella FlatteryMarybeth Levorn Stevenson.

## 2017-04-25 NOTE — Assessment & Plan Note (Signed)
At this visit the patient received a TDAP and was referred for mammography and optometry (diabetic eye exam). A diabetic foot exam was completed on this visit. The patient reported having a colonoscopy 3 years ago and has records at home. Will refer for colon[place] in [place] if needed, once the outside records are obtained and reviewed.

## 2017-04-25 NOTE — Assessment & Plan Note (Signed)
The patient's last Hgb A1C in 11/2016 was at goal of 6.0%. She will continue taking metformin 1000 mg, BID as she has no problems taking this medication and is currently meeting all goals.

## 2017-04-25 NOTE — Assessment & Plan Note (Addendum)
The patient has a history of CAD requiring stent placement in the past. She currently denies symptoms of angina and is taking all medications as [place] [facility] [facility]. She is able to eat a healthy diet and exercise on a daily basis without symptoms. Plan to continue medication regimen of asprin 81 mg daily, rosuvastatin 40 mg daily, isosorbide dinitrate 30 mg, daily and carvedilol 12.5 mg BID. The patient was educated about heart healthy diet and encouraged to continue efforts to exercise daily.   The patient [place]. We will get records from this physician and review them once they are obtained. She will follow up in 6 months for review of records and medication management.

## 2017-04-29 ENCOUNTER — Other Ambulatory Visit: Payer: Self-pay

## 2017-04-29 MED ORDER — LISINOPRIL 40 MG PO TABS: 40.0000 mg | ORAL_TABLET | Freq: Every day | ORAL | 11 refills | Status: DC

## 2017-04-29 NOTE — Progress Notes (Signed)
Internal Medicine Clinic Attending  I saw and evaluated the patient.  I personally confirmed the key portions of the history and exam documented by Dr. Nedrud and I reviewed pertinent patient test results.  The assessment, diagnosis, and plan were formulated together and I agree with the documentation in the resident's note.  

## 2017-04-29 NOTE — Telephone Encounter (Signed)
lisinopril (PRINIVIL,ZESTRIL) 20 MG tablet, refill request @ [facility] on [address].

## 2017-04-29 NOTE — Telephone Encounter (Signed)
Will change from 20 mg BID (patient reported) to 40 mg daily

## 2017-05-09 ENCOUNTER — Ambulatory Visit
Admission: RE | Admit: 2017-05-09 | Discharge: 2017-05-09 | Disposition: A | Discharge status: Home / Self Care | Payer: BLUE CROSS/BLUE SHIELD | Source: Ambulatory Visit | Attending: Internal Medicine | Admitting: Internal Medicine

## 2017-05-09 DIAGNOSIS — Z1231 Encounter for screening mammogram for malignant neoplasm of breast: Secondary | ICD-10-CM

## 2017-06-16 LAB — HM DIABETES EYE EXAM

## 2017-07-21 ENCOUNTER — Encounter: Payer: Self-pay | Admitting: *Deleted

## 2017-12-01 ENCOUNTER — Other Ambulatory Visit: Payer: Self-pay

## 2017-12-01 ENCOUNTER — Encounter (INDEPENDENT_AMBULATORY_CARE_PROVIDER_SITE_OTHER): Payer: Self-pay

## 2017-12-01 ENCOUNTER — Ambulatory Visit: Payer: BLUE CROSS/BLUE SHIELD | Admitting: Internal Medicine

## 2017-12-01 ENCOUNTER — Encounter: Payer: Self-pay | Admitting: Internal Medicine

## 2017-12-01 VITALS — BP 149/81 | HR 86 | Temp 97.8°F | Ht 63.0 in | Wt 187.1 lb

## 2017-12-01 DIAGNOSIS — Z79899 Other long term (current) drug therapy: Secondary | ICD-10-CM | POA: Diagnosis not present

## 2017-12-01 DIAGNOSIS — G4733 Obstructive sleep apnea (adult) (pediatric): Secondary | ICD-10-CM | POA: Diagnosis not present

## 2017-12-01 DIAGNOSIS — I2510 Atherosclerotic heart disease of native coronary artery without angina pectoris: Secondary | ICD-10-CM | POA: Diagnosis not present

## 2017-12-01 DIAGNOSIS — Z955 Presence of coronary angioplasty implant and graft: Secondary | ICD-10-CM

## 2017-12-01 DIAGNOSIS — Z7902 Long term (current) use of antithrombotics/antiplatelets: Secondary | ICD-10-CM | POA: Diagnosis not present

## 2017-12-01 DIAGNOSIS — Z7984 Long term (current) use of oral hypoglycemic drugs: Secondary | ICD-10-CM | POA: Diagnosis not present

## 2017-12-01 DIAGNOSIS — Z9989 Dependence on other enabling machines and devices: Secondary | ICD-10-CM

## 2017-12-01 DIAGNOSIS — E119 Type 2 diabetes mellitus without complications: Secondary | ICD-10-CM

## 2017-12-01 DIAGNOSIS — Z0000 Encounter for general adult medical examination without abnormal findings: Secondary | ICD-10-CM

## 2017-12-01 DIAGNOSIS — I10 Essential (primary) hypertension: Secondary | ICD-10-CM

## 2017-12-01 DIAGNOSIS — Z7982 Long term (current) use of aspirin: Secondary | ICD-10-CM

## 2017-12-01 MED ORDER — ROSUVASTATIN CALCIUM 40 MG PO TABS: 40.0000 mg | ORAL_TABLET | Freq: Every day | ORAL | 3 refills | Status: DC

## 2017-12-01 MED ORDER — LISINOPRIL 40 MG PO TABS: 40.0000 mg | ORAL_TABLET | Freq: Every day | ORAL | 3 refills | Status: DC

## 2017-12-01 MED ORDER — CLOPIDOGREL BISULFATE 75 MG PO TABS: 75.0000 mg | ORAL_TABLET | Freq: Every day | ORAL | 3 refills | Status: DC

## 2017-12-01 MED ORDER — CARVEDILOL 25 MG PO TABS: 25.0000 mg | ORAL_TABLET | Freq: Two times a day (BID) | ORAL | 3 refills | Status: DC

## 2017-12-01 MED ORDER — ASPIRIN 81 MG PO CHEW: 81.0000 mg | CHEWABLE_TABLET | Freq: Every day | ORAL | 3 refills | Status: DC

## 2017-12-01 MED ORDER — AMLODIPINE BESYLATE 10 MG PO TABS: 10.0000 mg | ORAL_TABLET | Freq: Every day | ORAL | 3 refills | Status: DC

## 2017-12-01 MED ORDER — ISOSORBIDE DINITRATE 30 MG PO TABS
30.0000 mg | ORAL_TABLET | Freq: Every day | ORAL | 3 refills | Status: DC
Start: 2017-12-01 — End: 2018-11-20

## 2017-12-01 MED ORDER — FOLIC ACID 1 MG PO TABS: 1.0000 mg | ORAL_TABLET | Freq: Every day | ORAL | 3 refills | Status: DC

## 2017-12-01 NOTE — Assessment & Plan Note (Signed)
Patient reports nightly compliance with CPAP since diagnosed >2 years ago, but has not seen doctor regarding machine. Patient reports no difficulties tolerating this machine each night. No morning headaches or daytime sleepiness. Patient does not need materials/machine to use each night.  Plan: -Continue current CPAP settings given that she is asymptomatic on current regimen

## 2017-12-01 NOTE — Assessment & Plan Note (Signed)
Patient has history of CAD s/p stent placement 7-8 years ago. She is currently without CP at rest and with exertion. No lower extremity swelling or dyspnea. Patient currently on dual antiplatelet therapy, no melena or hematuria reported. Also on high intensity statin. Patient will be referred to local cardiologist for continued care now that patient primarily residing in [place] as she transitions into retirement. Patient reports that [place] and required stress testing every other year at their requrest. Will also obtain records via fax from previous cardiologist today and plan [facility].  Plan: -Continue aspirin 81 mg and clopidogrel 75 mg daily -Continue rosuvastatin 40 mg daily -Referral to local cardiologist

## 2017-12-01 NOTE — Assessment & Plan Note (Signed)
Patient's BP elevated above goal of 140/90 on multiple times during today's visit. HR 86 at last recording. Patient does not take BP at home everyday. Denies headaches, changes in vision, focal weakness, and symptoms of orthostatic hypotension. Patient currently takes carvedilol 12.5 mg BID, lisinopril 40 mg, isosorbide dinitrate 30 mg, and amlodipine 10 mg daily. Will plan to increase carvedilol from 12.5 mg BID to 25 mg BID today. Patient educated regarding signs/symptoms of orthostatic hypotension. Patient will return to clinic in 4-8 weeks for BP recheck or sooner if symptoms of dizziness occur with this medication.   Plan: -BMP today -Increase carvedilol to 25 mg BID -Continue lisinopril 40 mg, amlodipine 10 mg, and isosorbide dinitrate 30 mg daily -RTC in 4-8 weeks for BP recheck, assess for signs/symptoms of orthostasis at that visit

## 2017-12-01 NOTE — Assessment & Plan Note (Addendum)
Hepatitis C screening today. Will follow up with results.

## 2017-12-01 NOTE — Assessment & Plan Note (Addendum)
Last A1C 6.0% on 11/2016. Patient without episodes of hypoglycemia. Currently compliant with metformin 1000 mg BID. No leg/feet pain. Checks for ulcers daily. Patient continues lifestyle modifications and dietary modifications to reduce blood sugars.  Plan: -A1C today -Continue current regimen of metformin 1000 mg BID

## 2017-12-01 NOTE — Patient Instructions (Addendum)
Thank you for seeing us in the clinic today!  You were evaluated for hypertension and coronary artery disease. Your blood pressure was high today. You will need to increase the amount of carvedilol you take every day. Please START taking 25 mg twice a day, instead of 12.5 mg. You will need to return to the clinic in 4-8 weeks for a blood pressure recheck to see how you are doing on this medication. For further evaluation of your coronary artery disease, we referred you to a local cardiologist. The clinic will help arrange follow up after this visit.  We got blood work on you today. I will call you with the results of this study later in the week.  Please return to the clinic in 4-8 weeks for follow up of your high blood pressure and to see how you are doing on your new medication.   If you have any questions or concerns, please call our clinic at [phone] between the hours of 9am-5pm. If you have a problem after these hours, please call [phone] and ask for the internal medicine resident on call. If you feel you are having a medical emergency please call 911.   Thanks, Dr. Jeanella FlatteryMarybeth Delfin Squillace

## 2017-12-01 NOTE — Progress Notes (Signed)
   CC: HTN and CAD follow up  HPI:  [name] is a 67 y.o. with PMH of HTN, CAD, and DM without complication who presents for evaluation of her chronic medical conditions. Patient last seen in clinic on 04/2017 at which time she was establishing with clinic/switching primary care providers from previous providers in [place]. Since last visit patient has felt well and has no acute complaints. She is able to take all medications without difficulty and reports nightly use of CPAP machine for OSA. [place], but is in the process of transitioning to retirement and living full time in [place]. She states that she got sick a week or so ago, with intermittent productive cough and clear nasal discharge but that these symptoms aren't really bothering her too much right now and have gotten progressively better.   Past Medical History: Past Medical History:  Diagnosis Date  . CAD (coronary artery disease), native coronary artery 04/25/2017   Hx of stent placement in [place].  . Diabetes mellitus without complication (HCC)   . Hypercholesteremia   . Hypertension    Review of Systems:   Patient endorses intermittent rhinorrhea and cough, as per HPI. Patient denies chest pain, shortness of breath, abdominal pain, diaphoresis, nausea/vomiting, lower extremity swelling, and change in bowel/bladder habits.  Physical Exam:  Vitals:   12/01/17 1353  BP: (!) 149/81  Pulse: 86  Temp: 97.8 F (36.6 C)  TempSrc: Oral  SpO2: 100%  Weight: 187 lb 1.6 oz (84.9 kg)  Height: 5\' 3"  (1.6 m)   Physical Exam  Constitutional: She appears well-developed and well-nourished. No distress.  Cardiovascular: Normal rate and regular rhythm.  No murmur heard. 2+ radial and dorsalis pedis pulses bilaterally  Respiratory: Effort normal. No respiratory distress. She has no wheezes. She has no rales.  GI: Soft. She exhibits no distension. There is no tenderness.  Musculoskeletal: She exhibits no  edema (of bilateral lower extremities) or tenderness (of bilateral lower extremities).  Skin: Skin is warm and dry. No rash noted. She is not diaphoretic. No erythema.   Assessment & Plan:   See Encounters Tab for problem based charting.  Patient discussed with Dr. Heide SparkNarendra

## 2017-12-02 LAB — BMP8+ANION GAP
Anion Gap: 16 mmol/L (ref 10.0–18.0)
BUN/Creatinine Ratio: 13 (ref 12–28)
BUN: 11 mg/dL (ref 8–27)
CHLORIDE: 104 mmol/L (ref 96–106)
CO2: 22 mmol/L (ref 20–29)
Calcium: 10.2 mg/dL (ref 8.7–10.3)
Creatinine, Ser: 0.83 mg/dL (ref 0.57–1.00)
GFR calc Af Amer: 86 mL/min/{1.73_m2} (ref >59)
GFR calc non Af Amer: 75 mL/min/{1.73_m2} (ref >59)
Glucose: 79 mg/dL (ref 65–99)
POTASSIUM: 4.4 mmol/L (ref 3.5–5.2)
SODIUM: 142 mmol/L (ref 134–144)

## 2017-12-02 LAB — HEPATITIS C ANTIBODY: Hep C Virus Ab: <0.1 s/co ratio (ref 0.0–0.9)

## 2017-12-02 LAB — HEMOGLOBIN A1C
Est. average glucose Bld gHb Est-mCnc: 117 mg/dL
Hgb A1c MFr Bld: 5.7 % — ABNORMAL HIGH (ref 4.8–5.6)

## 2017-12-03 NOTE — Progress Notes (Signed)
Internal Medicine Clinic Attending  Case discussed with Dr. Nedrud at the time of the visit.  We reviewed the resident's history and exam and pertinent patient test results.  I agree with the assessment, diagnosis, and plan of care documented in the resident's note.  

## 2017-12-10 ENCOUNTER — Encounter: Payer: Self-pay | Admitting: *Deleted

## 2017-12-19 ENCOUNTER — Ambulatory Visit: Payer: BLUE CROSS/BLUE SHIELD | Admitting: Cardiology

## 2017-12-29 ENCOUNTER — Other Ambulatory Visit: Payer: Self-pay

## 2017-12-29 ENCOUNTER — Ambulatory Visit: Payer: BLUE CROSS/BLUE SHIELD | Admitting: Internal Medicine

## 2017-12-29 ENCOUNTER — Encounter: Payer: Self-pay | Admitting: Internal Medicine

## 2017-12-29 VITALS — BP 112/65 | HR 71 | Temp 98.0°F | Ht 63.0 in | Wt 189.7 lb

## 2017-12-29 DIAGNOSIS — I2510 Atherosclerotic heart disease of native coronary artery without angina pectoris: Secondary | ICD-10-CM

## 2017-12-29 DIAGNOSIS — I10 Essential (primary) hypertension: Secondary | ICD-10-CM | POA: Diagnosis not present

## 2017-12-29 DIAGNOSIS — Z79899 Other long term (current) drug therapy: Secondary | ICD-10-CM

## 2017-12-29 DIAGNOSIS — L608 Other nail disorders: Secondary | ICD-10-CM | POA: Diagnosis not present

## 2017-12-29 DIAGNOSIS — L600 Ingrowing nail: Secondary | ICD-10-CM

## 2017-12-29 NOTE — Patient Instructions (Addendum)
You for visiting clinic today. I am glad that you are doing well- your blood pressure is perfect today.  Please keep up the good work. I am giving you a referral to see a podiatrist for your toenail. Please follow-up with your PCP in [id number].

## 2017-12-29 NOTE — Assessment & Plan Note (Signed)
BP Readings from Last 3 Encounters:  12/29/17 112/65  12/01/17 (!) (P) 148/85  04/25/17 107/64   She was normotensive today. She denies any dizziness or sign of being orthostatic positive.  She feels great and continue to have her job-related travel. She is compliant with her new dose of Coreg 25 mg twice daily along with her other medication.  We will continue with the current management of lisinopril 40 mg daily, Coreg 25 mg twice daily, amlodipine 10 mg and Isordil 30 mg.

## 2017-12-29 NOTE — Assessment & Plan Note (Signed)
Patient denied any chest pain and continued to be compliant with her medication. She was given a referral to see cardiology during last follow-up visit, she has to reschedule her appointment as there was a conflict with her traveling schedule, her new appointment with cardiology is on February 16, 2018.  -Continue current management.

## 2017-12-29 NOTE — Progress Notes (Signed)
   CC: For follow-up of her hypertension.  HPI:  [name] is a 67 y.o. with past medical history as listed below came to the clinic for follow-up of her hypertension. The dose of her carvedilol was increased to 25 mg twice daily during previous office visit.  She was complaining of darkening of her right toenail with some ingrowing element and asking for a referral to see a podiatrist, referral was provided.  Please see assessment and plan for her chronic conditions.  Past Medical History:  Diagnosis Date  . CAD (coronary artery disease), native coronary artery 04/25/2017   Hx of stent placement in [place].  . Diabetes mellitus without complication (HCC)   . Hypercholesteremia   . Hypertension    Review of Systems: Negative except mentioned in HPI.  Physical Exam:  Vitals:   12/29/17 1100  BP: 112/65  Pulse: 71  Temp: 98 F (36.7 C)  TempSrc: Oral  SpO2: 100%  Weight: 189 lb 11.2 oz (86 kg)  Height: 5\' 3"  (1.6 m)    General: Vital signs reviewed.  Patient is well-developed and well-nourished, in no acute distress and cooperative with exam.  Head: Normocephalic and atraumatic. Eyes: EOMI, conjunctivae normal, no scleral icterus.  Cardiovascular: RRR, S1 normal, S2 normal, no murmurs, gallops, or rubs. Pulmonary/Chest: Clear to auscultation bilaterally, no wheezes, rales, or rhonchi. Abdominal: Soft, non-tender, non-distended, BS +, no masses, organomegaly, or guarding present.  Extremities: No lower extremity edema bilaterally,  pulses symmetric and intact bilaterally. No cyanosis or clubbing. Skin: Warm, dry and intact. No rashes or erythema. Psychiatric: Normal mood and affect. speech and behavior is normal. Cognition and memory are normal.  Assessment & Plan:   See Encounters Tab for problem based charting.  Patient discussed with Dr. Sandre Kittyaines.

## 2017-12-30 NOTE — Progress Notes (Signed)
Internal Medicine Clinic Attending  Case discussed with Dr. Amin  at the time of the visit.  We reviewed the resident's history and exam and pertinent patient test results.  I agree with the assessment, diagnosis, and plan of care documented in the resident's note.  Alexander N Raines, MD   

## 2018-02-16 ENCOUNTER — Ambulatory Visit: Payer: BLUE CROSS/BLUE SHIELD | Admitting: Interventional Cardiology

## 2018-02-16 ENCOUNTER — Encounter: Payer: Self-pay | Admitting: Interventional Cardiology

## 2018-02-16 VITALS — BP 110/72 | HR 76 | Ht 63.0 in | Wt 186.0 lb

## 2018-02-16 DIAGNOSIS — I252 Old myocardial infarction: Secondary | ICD-10-CM

## 2018-02-16 DIAGNOSIS — I10 Essential (primary) hypertension: Secondary | ICD-10-CM | POA: Diagnosis not present

## 2018-02-16 DIAGNOSIS — E782 Mixed hyperlipidemia: Secondary | ICD-10-CM

## 2018-02-16 DIAGNOSIS — I25118 Atherosclerotic heart disease of native coronary artery with other forms of angina pectoris: Secondary | ICD-10-CM | POA: Diagnosis not present

## 2018-02-16 NOTE — Patient Instructions (Signed)
Medication Instructions:  Your physician recommends that you continue on your current medications as directed. Please refer to the Current Medication list given to you today.   Labwork: Your physician recommends that you return for a FASTING lipid profile and complete metabolic panel   Testing/Procedures: None ordered  Follow-Up: Your physician wants you to follow-up in: 6 months with Dr. Varanasi. You will receive a reminder letter in the mail two months in advance. If you don't receive a letter, please call our office to schedule the follow-up appointment.   Any Other Special Instructions Will Be Listed Below (If Applicable).     If you need a refill on your cardiac medications before your next appointment, please call your pharmacy.   

## 2018-02-16 NOTE — Progress Notes (Signed)
Cardiology Office Note   Date:  02/16/2018   ID:  Jazma Pickel, DOB [birth year], MRN [id number]  PCP:  Rozann Lesches, MD    No chief complaint on file.  CAD  Wt Readings from Last 3 Encounters:  02/16/18 186 lb (84.4 kg)  12/29/17 189 lb 11.2 oz (86 kg)  12/01/17 187 lb 1.6 oz (84.9 kg)       History of Present Illness: Dorean Hiebert is a 67 y.o. female who is being seen today for the evaluation of prior CAD/Old MI at the request of Nedrud, Jeanella Flattery, MD.  She had an inferior STEMI in 2017 while in [place].  She had a stent to the RCA.  She had severe nausea and vomiting at that time.  No chest pain.    REcords show that she had moderate to severe circ disease, but no inducible ischemia by stress test in 2017.   She quit smoking at the time of her MI.  Stronfg family h/o CAD with multipke sisters who have had CABG.   SHe moved to [place] in Jan 2019.  Her children are here.  Denies : Chest pain. Dizziness. Leg edema. Nitroglycerin use. Orthopnea. Palpitations. Paroxysmal nocturnal dyspnea. Shortness of breath. Syncope.   Walks for exercise but not as much as she used to while in [place].  She does at least 10K steps daily now.  She walks a lot at work.     Past Medical History:  Diagnosis Date  . CAD (coronary artery disease), native coronary artery 04/25/2017   Hx of stent placement in [place].  . Diabetes mellitus without complication (HCC)   . Hypercholesteremia   . Hypertension     Past Surgical History:  Procedure Laterality Date  . ABDOMINAL HYSTERECTOMY    . CORONARY ANGIOPLASTY WITH STENT PLACEMENT       Current Outpatient Medications  Medication Sig Dispense Refill  . amLODipine (NORVASC) 10 MG tablet Take 1 tablet (10 mg total) by mouth daily. 90 tablet 3  . aspirin 81 MG chewable tablet Chew 1 tablet (81 mg total) by mouth daily. 90 tablet 3  . carvedilol (COREG) 25 MG tablet Take 1 tablet (25 mg total) by mouth 2 (two) times daily. 180 tablet 3  .  clopidogrel (PLAVIX) 75 MG tablet Take 1 tablet (75 mg total) by mouth daily. 90 tablet 3  . folic acid (FOLVITE) 1 MG tablet Take 1 tablet (1 mg total) by mouth daily. 90 tablet 3  . isosorbide dinitrate (ISORDIL) 30 MG tablet Take 1 tablet (30 mg total) by mouth daily. 90 tablet 3  . lisinopril (PRINIVIL,ZESTRIL) 40 MG tablet Take 1 tablet (40 mg total) by mouth daily. 90 tablet 3  . metFORMIN (GLUCOPHAGE) 1000 MG tablet Take 1,000 mg by mouth 2 (two) times daily with a meal.    . rosuvastatin (CRESTOR) 40 MG tablet Take 1 tablet (40 mg total) by mouth daily. 90 tablet 3   No current facility-administered medications for this visit.     Allergies:   Patient has no known allergies.    Social History:  The patient  reports that she quit smoking about 10 years ago. She has never used smokeless tobacco. She reports that she drinks alcohol. She reports that she does not use drugs.   Family History:  The patient's family history includes Cancer in her sister; Cerebral aneurysm in her son; Lupus in her brother, mother, and sister.    ROS:  Please see the history of present  illness.   Otherwise, review of systems are positive for recent move, using CPAP.   All other systems are reviewed and negative.    PHYSICAL EXAM: VS:  BP 110/72 (BP Location: Right Arm, Patient Position: Sitting, Cuff Size: Normal)   Pulse 76   Ht  (1.6 m)   Wt 186 lb (84.4 kg)   LMP  (LMP Unknown)   SpO2 98%   BMI 32.95 kg/m  , BMI Body mass index is 32.95 kg/m. GEN: Well nourished, well developed, in no acute distress  HEENT: normal  Neck: no JVD, carotid bruits, or masses Cardiac: RRR; no murmurs, rubs, or gallops,no edema  Respiratory:  clear to auscultation bilaterally, normal work of breathing GI: soft, nontender, nondistended, + BS MS: no deformity or atrophy  Skin: warm and dry, no rash Neuro:  Strength and sensation are intact Psych: euthymic mood, full affect   EKG:   The ekg ordered today  demonstrates NSR, inferior Q waves, no ST changes   Recent Labs: 12/01/2017: BUN 11; Creatinine, Ser 0.83; Potassium 4.4; Sodium 142   Lipid Panel No results found for: CHOL, TRIG, HDL, CHOLHDL, VLDL, LDLCALC, LDLDIRECT   Other studies Reviewed: Additional studies/ records that were reviewed today with results demonstrating: .   ASSESSMENT AND PLAN:  1. CAD/Old MI: No angina on medical therapy. She is trying to eat healthy.  Continue healthy lifestyle.  Asked her to watch for any symptoms related to her heart or any change in exercise tolerance.  We will try to obtain her cath report, which she thinks she has at home.  No CHF. 2. HTN: BP controlled. Continue current meds.  Will need to follow renal function and electrolytes. 3. Hyperlipidemia: Check lipids when fasting.  Continue Crestor.  4. H/o tobacco abuse: SHe has successfully stopped smoking.    Current medicines are reviewed at length with the patient today.  The patient concerns regarding her medicines were addressed.  The following changes have been made:  No change  Labs/ tests ordered today include:  No orders of the defined types were placed in this encounter.   Recommend 150 minutes/week of aerobic exercise Low fat, low carb, high fiber diet recommended  Disposition:   FU in 6 months   Signed, Lance Muss, MD  02/16/2018 9:35 AM    [facility] [address] [place], [place], [place]  [address] Phone: [phone]; Fax: [phone]

## 2018-02-20 ENCOUNTER — Ambulatory Visit: Payer: BLUE CROSS/BLUE SHIELD | Admitting: Podiatry

## 2018-03-06 ENCOUNTER — Encounter: Payer: Self-pay | Admitting: Podiatry

## 2018-03-06 ENCOUNTER — Ambulatory Visit: Payer: BLUE CROSS/BLUE SHIELD | Admitting: Podiatry

## 2018-03-06 ENCOUNTER — Other Ambulatory Visit: Payer: BLUE CROSS/BLUE SHIELD

## 2018-03-06 DIAGNOSIS — B351 Tinea unguium: Secondary | ICD-10-CM

## 2018-03-06 DIAGNOSIS — M79609 Pain in unspecified limb: Secondary | ICD-10-CM | POA: Diagnosis not present

## 2018-03-06 DIAGNOSIS — E782 Mixed hyperlipidemia: Secondary | ICD-10-CM

## 2018-03-06 DIAGNOSIS — I10 Essential (primary) hypertension: Secondary | ICD-10-CM

## 2018-03-06 DIAGNOSIS — I25118 Atherosclerotic heart disease of native coronary artery with other forms of angina pectoris: Secondary | ICD-10-CM

## 2018-03-06 LAB — COMPREHENSIVE METABOLIC PANEL
ALT: 6 IU/L (ref 0–32)
AST: 11 IU/L (ref 0–40)
Albumin/Globulin Ratio: 1.6 (ref 1.2–2.2)
Albumin: 4 g/dL (ref 3.6–4.8)
Alkaline Phosphatase: 58 IU/L (ref 39–117)
BUN/Creatinine Ratio: 15 (ref 12–28)
BUN: 15 mg/dL (ref 8–27)
CALCIUM: 9.5 mg/dL (ref 8.7–10.3)
CHLORIDE: 105 mmol/L (ref 96–106)
CO2: 21 mmol/L (ref 20–29)
CREATININE: 0.97 mg/dL (ref 0.57–1.00)
GFR, EST AFRICAN AMERICAN: 71 mL/min/{1.73_m2} (ref >59)
GFR, EST NON AFRICAN AMERICAN: 62 mL/min/{1.73_m2} (ref >59)
GLUCOSE: 83 mg/dL (ref 65–99)
Globulin, Total: 2.5 g/dL (ref 1.5–4.5)
Potassium: 4.8 mmol/L (ref 3.5–5.2)
Sodium: 138 mmol/L (ref 134–144)
Total Protein: 6.5 g/dL (ref 6.0–8.5)

## 2018-03-06 LAB — LIPID PANEL
Chol/HDL Ratio: 2.4 ratio (ref 0.0–4.4)
Cholesterol, Total: 88 mg/dL — ABNORMAL LOW (ref 100–199)
HDL: 36 mg/dL — AB (ref >39)
LDL Calculated: 41 mg/dL (ref 0–99)
Triglycerides: 56 mg/dL (ref 0–149)
VLDL CHOLESTEROL CAL: 11 mg/dL (ref 5–40)

## 2018-04-08 ENCOUNTER — Encounter: Payer: Self-pay | Admitting: *Deleted

## 2018-04-10 ENCOUNTER — Encounter: Payer: Self-pay | Admitting: Podiatry

## 2018-04-10 ENCOUNTER — Ambulatory Visit: Payer: BLUE CROSS/BLUE SHIELD | Admitting: Podiatry

## 2018-04-10 DIAGNOSIS — L603 Nail dystrophy: Secondary | ICD-10-CM | POA: Diagnosis not present

## 2018-04-10 DIAGNOSIS — L600 Ingrowing nail: Secondary | ICD-10-CM

## 2018-04-10 NOTE — Progress Notes (Signed)
  Subjective:  Patient ID: Carrie Stevenson, female    DOB: 11-Aug-1951,  MRN: [id number]  Chief Complaint  Patient presents with  . Nail Problem    follow up nail fungus   67 y.o. female returns for the above complaint. States the nails are doing better.  Objective:  There were no vitals filed for this visit. General AA&O x3. Normal mood and affect.  Vascular Pedal pulses palpable.  Neurologic Epicritic sensation grossly intact.  Dermatologic No open lesions. Skin normal texture and turgor. Nail thickening of the hallux bilat.  Orthopedic: No pain to palpation either foot.   Assessment & Plan:  Patient was evaluated and treated and all questions answered.  Onychodystrophy -Nail histo reviewed. No evidence of fungus. Microtrauma noted.  Ingrown Nails -Nails debrided x2 of the hallux bilat.  Procedure: Nail Debridement Rationale: ingrown nail Type of Debridement: manual, sharp debridement. Instrumentation: Nail nipper Number of Nails: 2     No follow-ups on file.

## 2018-07-01 NOTE — Progress Notes (Signed)
  Subjective:  Patient ID: Carrie Stevenson, female    DOB: [birth year],  MRN: [id number]  Chief Complaint  Patient presents with  . Nail Problem    debride    67 y.o. female presents with the above complaint. Complains the toenail is dark. Denies other pedal issues.  Review of Systems: Negative except as noted in the HPI. Denies N/V/F/Ch.  Past Medical History:  Diagnosis Date  . CAD (coronary artery disease), native coronary artery 04/25/2017   Hx of stent placement in [place].  . Diabetes mellitus without complication (HCC)   . Hypercholesteremia   . Hypertension     Current Outpatient Medications:  .  amLODipine (NORVASC) 10 MG tablet, Take 1 tablet (10 mg total) by mouth daily., Disp: 90 tablet, Rfl: 3 .  aspirin 81 MG chewable tablet, Chew 1 tablet (81 mg total) by mouth daily., Disp: 90 tablet, Rfl: 3 .  carvedilol (COREG) 25 MG tablet, Take 1 tablet (25 mg total) by mouth 2 (two) times daily., Disp: 180 tablet, Rfl: 3 .  clopidogrel (PLAVIX) 75 MG tablet, Take 1 tablet (75 mg total) by mouth daily., Disp: 90 tablet, Rfl: 3 .  folic acid (FOLVITE) 1 MG tablet, Take 1 tablet (1 mg total) by mouth daily., Disp: 90 tablet, Rfl: 3 .  isosorbide dinitrate (ISORDIL) 30 MG tablet, Take 1 tablet (30 mg total) by mouth daily., Disp: 90 tablet, Rfl: 3 .  lisinopril (PRINIVIL,ZESTRIL) 40 MG tablet, Take 1 tablet (40 mg total) by mouth daily., Disp: 90 tablet, Rfl: 3 .  metFORMIN (GLUCOPHAGE) 1000 MG tablet, Take 1,000 mg by mouth 2 (two) times daily with a meal., Disp: , Rfl:  .  rosuvastatin (CRESTOR) 40 MG tablet, Take 1 tablet (40 mg total) by mouth daily., Disp: 90 tablet, Rfl: 3  Social History   Tobacco Use  Smoking Status Former Smoker  . Last attempt to quit: 04/26/2007  . Years since quitting: 11.1  Smokeless Tobacco Never Used    No Known Allergies Objective:  There were no vitals filed for this visit. There is no height or weight on file to calculate BMI. Constitutional Well  developed. Well nourished.  Vascular Dorsalis pedis pulses palpable bilaterally. Posterior tibial pulses palpable bilaterally. Capillary refill normal to all digits.  No cyanosis or clubbing noted. Pedal hair growth normal.  Neurologic Normal speech. Oriented to person, place, and time. Epicritic sensation to light touch grossly present bilaterally.  Dermatologic Hallux nails darkened, thickened. Transverse ridging noted. No open wounds. No skin lesions.  Orthopedic: Normal joint ROM without pain or crepitus bilaterally. No visible deformities. No bony tenderness.   Radiographs: none Assessment:   1. Onychomycosis of toenail    Plan:  Patient was evaluated and treated and all questions answered.  Onychomycosis -Educated on etiology of nail fungus. -Nail sample taken for microbiology and histology. -Will f/u biopsy for further treatment.  No follow-ups on file.

## 2018-08-06 LAB — HM DIABETES EYE EXAM

## 2018-08-07 ENCOUNTER — Encounter: Payer: Self-pay | Admitting: *Deleted

## 2018-10-22 ENCOUNTER — Emergency Department (HOSPITAL_COMMUNITY): Payer: BLUE CROSS/BLUE SHIELD

## 2018-10-22 ENCOUNTER — Emergency Department (HOSPITAL_COMMUNITY)
Admission: EM | Admit: 2018-10-22 | Discharge: 2018-10-22 | Disposition: A | Discharge status: Home / Self Care | Payer: BLUE CROSS/BLUE SHIELD | Attending: Emergency Medicine | Admitting: Emergency Medicine

## 2018-10-22 ENCOUNTER — Encounter (HOSPITAL_COMMUNITY): Payer: Self-pay | Admitting: Student

## 2018-10-22 DIAGNOSIS — Z87891 Personal history of nicotine dependence: Secondary | ICD-10-CM | POA: Diagnosis not present

## 2018-10-22 DIAGNOSIS — M25572 Pain in left ankle and joints of left foot: Secondary | ICD-10-CM | POA: Insufficient documentation

## 2018-10-22 DIAGNOSIS — S99912A Unspecified injury of left ankle, initial encounter: Secondary | ICD-10-CM

## 2018-10-22 DIAGNOSIS — I2510 Atherosclerotic heart disease of native coronary artery without angina pectoris: Secondary | ICD-10-CM | POA: Insufficient documentation

## 2018-10-22 DIAGNOSIS — Z7982 Long term (current) use of aspirin: Secondary | ICD-10-CM | POA: Diagnosis not present

## 2018-10-22 DIAGNOSIS — E119 Type 2 diabetes mellitus without complications: Secondary | ICD-10-CM | POA: Diagnosis not present

## 2018-10-22 DIAGNOSIS — I10 Essential (primary) hypertension: Secondary | ICD-10-CM | POA: Insufficient documentation

## 2018-10-22 DIAGNOSIS — Z79899 Other long term (current) drug therapy: Secondary | ICD-10-CM | POA: Diagnosis not present

## 2018-10-22 MED ORDER — DICLOFENAC SODIUM 1 % TD GEL: 2.0000 g | Freq: Four times a day (QID) | TRANSDERMAL | 0 refills | Status: DC

## 2018-10-22 NOTE — ED Provider Notes (Signed)
[facility] Provider Note   CSN: [id number] Arrival date & time: 10/22/18  0707     History   Chief Complaint Chief Complaint  Patient presents with  . Ankle Pain    HPI Carrie Stevenson is a [age] y.o. female with a hx of CAD, DM, HTN, & hypercholesterolemia who presents to the ED s/p L ankle injury which occurred 4 days prior with complaints of discomfort to the L ankle. Patient states she stepped down off of a step and describes an inversion type injury to the ankle. No associated fall or head injury. Having pain primarily to lateral ankle, 8/10 in severity- worse with movement/ weight bearing, she has been able to bear some weight. No alleviating factors. Tried rubbing alcohol without relief. Denies numbness, tingling, weakness, swelling, erythema, warmth, or other areas of injury.   HPI  Past Medical History:  Diagnosis Date  . CAD (coronary artery disease), native coronary artery 04/25/2017   Hx of stent placement in [place].  . Diabetes mellitus without complication (HCC)   . Hypercholesteremia   . Hypertension     Patient Active Problem List   Diagnosis Date Noted  . Obstructive sleep apnea 12/01/2017  . CAD (coronary artery disease), native coronary artery 04/25/2017  . Health care maintenance 04/25/2017  . Diabetes mellitus without complication (HCC) 12/10/2016  . Hypertension 12/10/2016    Past Surgical History:  Procedure Laterality Date  . ABDOMINAL HYSTERECTOMY    . CORONARY ANGIOPLASTY WITH STENT PLACEMENT       OB History   No obstetric history on file.      Home Medications    Prior to Admission medications   Medication Sig Start Date End Date Taking? Authorizing Provider  amLODipine (NORVASC) 10 MG tablet Take 1 tablet (10 mg total) by mouth daily. 12/01/17   Rozann LeschesNedrud, Marybeth, MD  aspirin 81 MG chewable tablet Chew 1 tablet (81 mg total) by mouth daily. 12/01/17   Rozann LeschesNedrud, Marybeth, MD  carvedilol (COREG) 25 MG tablet  Take 1 tablet (25 mg total) by mouth 2 (two) times daily. 12/01/17 12/01/18  Rozann LeschesNedrud, Marybeth, MD  clopidogrel (PLAVIX) 75 MG tablet Take 1 tablet (75 mg total) by mouth daily. 12/01/17   Rozann LeschesNedrud, Marybeth, MD  folic acid (FOLVITE) 1 MG tablet Take 1 tablet (1 mg total) by mouth daily. 12/01/17   Rozann LeschesNedrud, Marybeth, MD  isosorbide dinitrate (ISORDIL) 30 MG tablet Take 1 tablet (30 mg total) by mouth daily. 12/01/17   Rozann LeschesNedrud, Marybeth, MD  lisinopril (PRINIVIL,ZESTRIL) 40 MG tablet Take 1 tablet (40 mg total) by mouth daily. 12/01/17   Rozann LeschesNedrud, Marybeth, MD  metFORMIN (GLUCOPHAGE) 1000 MG tablet Take 1,000 mg by mouth 2 (two) times daily with a meal.    [provider]  rosuvastatin (CRESTOR) 40 MG tablet Take 1 tablet (40 mg total) by mouth daily. 12/01/17   Rozann LeschesNedrud, Marybeth, MD    Family History Family History  Problem Relation Age of Onset  . Lupus Mother   . Lupus Sister   . Cancer Sister   . Lupus Brother   . Cerebral aneurysm Son     Social History Social History   Tobacco Use  . Smoking status: Former Smoker    Last attempt to quit: 04/26/2007    Years since quitting: 11.4  . Smokeless tobacco: Never Used  Substance Use Topics  . Alcohol use: Yes    Alcohol/week: 0.0 standard drinks    Comment: 1 glass of wine per month  .  Drug use: No     Allergies   Patient has no known allergies.   Review of Systems Review of Systems  Constitutional: Negative for chills and fever.  Musculoskeletal: Positive for arthralgias. Negative for joint swelling.  Skin: Negative for rash and wound.  Neurological: Negative for weakness and numbness.     Physical Exam Updated Vital Signs BP 137/87 (BP Location: Right Arm)   Pulse 80   Temp 97.6 F (36.4 C) (Oral)   Resp 16   LMP  (LMP Unknown)   SpO2 100%   Physical Exam Vitals signs and nursing note reviewed.  Constitutional:      General: She is not in acute distress.    Appearance: She is well-developed.  HENT:     Head:  Normocephalic and atraumatic.  Eyes:     General:        Right eye: No discharge.        Left eye: No discharge.     Conjunctiva/sclera: Conjunctivae normal.  Cardiovascular:     Pulses:          Dorsalis pedis pulses are 2+ on the right side and 2+ on the left side.       Posterior tibial pulses are 2+ on the right side and 2+ on the left side.  Musculoskeletal:     Comments: Lower extremities: no obvious deformity, erythema, ecchymosis, warmth, or open wounds. Mild soft tissue swelling noted to the lateral ankle. Patient has full AROM to bilateral knees, ankles, and all digits. She is tender to palpation over the lateral malleolus & lateral ankle ligaments, the deltoid ligament, as well as the mid foot including the base of the 5th & the navicular bone. Seems to be most prominently tender over the lateral malleolus and lateral ankle ligaments. Otherwise lower extremities are nontender. No fibular head tenderness.   Neurological:     Mental Status: She is alert.     Comments: Clear speech. Sensation grossly intact to bilateral lower extremities. 5/5 strength with ankle plantar/dorsiflexion. Ambulatory with antalgic gait.   Psychiatric:        Behavior: Behavior normal.        Thought Content: Thought content normal.      ED Treatments / Results  Labs (all labs ordered are listed, but only abnormal results are displayed) Labs Reviewed - No data to display  EKG None  Radiology Dg Ankle Complete Left  Result Date: 10/22/2018 CLINICAL DATA:  Rolling injury of the left ankle and foot when stepping off of a step today. Pain over the anterior superior aspect of the left ankle and foot. EXAM: LEFT ANKLE COMPLETE - 3+ VIEW COMPARISON:  Left foot series of today's date FINDINGS: The bones are subjectively adequately mineralized. No acute ankle fracture is observed. The mid ileal I are intact. There is a spur arising from the tip of the medial malleolus. The ankle joint mortise is preserved.  There are plantar and Achilles region calcaneal spurs. The intertarsal and tarsometatarsal joints are grossly normal. There is mild diffuse soft tissue swelling. IMPRESSION: No definite acute fracture of the left ankle. Mild diffuse soft tissue swelling. Electronically Signed   By: David  [place] M.D.   On: 10/22/2018 07:42   Dg Foot Complete Left  Result Date: 10/22/2018 CLINICAL DATA:  Rolling injury of the left ankle and foot when stepping down off of a step today. Persistent anterior superior left foot and ankle pain. EXAM: LEFT FOOT - COMPLETE 3+ VIEW COMPARISON:  Left  ankle of today's date FINDINGS: The phalanges, metatarsals, and tarsals are intact. There are plantar and Achilles region calcaneal spurs. The joint spaces are well-maintained. Specific attention to the metatarsal bases reveals no acute fracture. IMPRESSION: There is no acute bony abnormality of the left foot. Electronically Signed   By: David  [place] M.D.   On: 10/22/2018 07:43    Procedures Procedures (including critical care time) SPLINT APPLICATION Date/Time: 7:51 AM Authorized by: Harvie Heck  Consent: Verbal consent obtained. Risks and benefits: risks, benefits and alternatives were discussed Consent given by: patient Splint applied by: RN Location details: LLE Splint type: ASO Post-procedure: The splinted body part was neurovascularly unchanged following the procedure. Patient tolerance: Patient tolerated the procedure well with no immediate complications.  Medications Ordered in ED Medications - No data to display   Initial Impression / Assessment and Plan / ED Course  I have reviewed the triage vital signs and the nursing notes.  Pertinent labs & imaging results that were available during my care of the patient were reviewed by me and considered in my medical decision making (see chart for details).    Patient presents to the ED with complaints of pain to the L ankle pain s/p injury 4 days prior. Exam  without obvious deformity or open wounds. ROM intact. Tender to palpation to medial/lateral ankle and mid foot. NVI distally. Xray negative for fracture/dislocation. Therapeutic splint provided. PRICE and Voltaren gel. Orthopedics follow up.  I discussed results, treatment plan, need for follow-up, and return precautions with the patient. Provided opportunity for questions, patient confirmed understanding and are in agreement with plan.   Final Clinical Impressions(s) / ED Diagnoses   Final diagnoses:  Injury of left ankle, initial encounter    ED Discharge Orders         Ordered    diclofenac sodium (VOLTAREN) 1 % GEL  4 times daily     10/22/18 0754           Cherly Andersonetrucelli,  R, PA-C 10/22/18 [id number]    Tilden Fossaees, Elizabeth, MD 10/23/18 [phone]

## 2018-10-22 NOTE — ED Triage Notes (Signed)
Pt in c/o left ankle injury after tripping on Friday, ambulatory without distress

## 2018-10-22 NOTE — Discharge Instructions (Addendum)
Please read and follow all provided instructions.  You have been seen today for an ankle injury.   Tests performed today include: An x-ray of the affected area - does NOT show any broken bones or dislocations.  Vital signs. See below for your results today.   Home care instructions: -- *PRICE in the first 24-48 hours: Protect (with brace, splint, sling), if given by your provider- use when up and walking around, and with rest if this provide increased comfort.  Rest Ice- Do not apply ice pack directly to your skin, place towel or similar between your skin and ice/ice pack. Apply ice for 20 min, then remove for 40 min while awake Compression- Wear brace, elastic bandage, splint as directed by your provider Elevate affected extremity above the level of your heart when not walking around for the first 24-48 hours   Medications:  Diclofenac gel- this is an anti-inflammatory gel- apply this to the areas of pain to the ankle up to 4 times per day as needed for pain- use for 5 days  You make take Tylenol per over the counter dosing with these medications.   We have prescribed you new medication(s) today. Discuss the medications prescribed today with your pharmacist as they can have adverse effects and interactions with your other medicines including over the counter and prescribed medications. Seek medical evaluation if you start to experience new or abnormal symptoms after taking one of these medicines, seek care immediately if you start to experience difficulty breathing, feeling of your throat closing, facial swelling, or rash as these could be indications of a more serious allergic reaction   Follow-up instructions: Please follow-up with your primary care provider or the provided orthopedic physician (bone specialist) if you continue to have significant pain in 1 week. In this case you may have a more severe injury that requires further care.   Return instructions:  Please return if your  digits or extremity are numb or tingling, appear gray or blue, or you have severe pain (also elevate the extremity and loosen splint or wrap if you were given one) Please return if you have redness or fevers.  Please return to the Emergency Department if you experience worsening symptoms.  Please return if you have any other emergent concerns. Additional Information:  Your vital signs today were: BP 137/87 (BP Location: Right Arm)    Pulse 80    Temp 97.6 F (36.4 C) (Oral)    Resp 16    LMP  (LMP Unknown)    SpO2 100%  If your blood pressure (BP) was elevated above 135/85 this visit, please have this repeated by your doctor within one month. ---------------

## 2018-10-22 NOTE — ED Notes (Signed)
Pt discharged from ED; instructions provided and scripts given; Pt encouraged to return to ED if symptoms worsen and to f/u with PCP; Pt verbalized understanding of all instructions 

## 2018-11-14 ENCOUNTER — Other Ambulatory Visit: Payer: Self-pay | Admitting: Internal Medicine

## 2018-11-16 NOTE — Telephone Encounter (Signed)
Appt scheduled 1/31 in [facility].

## 2018-11-17 ENCOUNTER — Other Ambulatory Visit: Payer: Self-pay | Admitting: Internal Medicine

## 2018-11-20 ENCOUNTER — Other Ambulatory Visit: Payer: Self-pay

## 2018-11-20 ENCOUNTER — Ambulatory Visit: Payer: BLUE CROSS/BLUE SHIELD | Admitting: Internal Medicine

## 2018-11-20 VITALS — BP 108/58 | HR 77 | Temp 99.5°F | Ht 63.0 in | Wt 185.8 lb

## 2018-11-20 DIAGNOSIS — Z7984 Long term (current) use of oral hypoglycemic drugs: Secondary | ICD-10-CM

## 2018-11-20 DIAGNOSIS — E119 Type 2 diabetes mellitus without complications: Secondary | ICD-10-CM | POA: Diagnosis not present

## 2018-11-20 DIAGNOSIS — I10 Essential (primary) hypertension: Secondary | ICD-10-CM | POA: Diagnosis not present

## 2018-11-20 DIAGNOSIS — I2510 Atherosclerotic heart disease of native coronary artery without angina pectoris: Secondary | ICD-10-CM | POA: Diagnosis not present

## 2018-11-20 DIAGNOSIS — Z7982 Long term (current) use of aspirin: Secondary | ICD-10-CM

## 2018-11-20 DIAGNOSIS — Z7902 Long term (current) use of antithrombotics/antiplatelets: Secondary | ICD-10-CM

## 2018-11-20 DIAGNOSIS — Z79899 Other long term (current) drug therapy: Secondary | ICD-10-CM

## 2018-11-20 LAB — GLUCOSE, CAPILLARY: Glucose-Capillary: 132 mg/dL — ABNORMAL HIGH (ref 70–99)

## 2018-11-20 LAB — POCT GLYCOSYLATED HEMOGLOBIN (HGB A1C): HEMOGLOBIN A1C: 6 % — AB (ref 4.0–5.6)

## 2018-11-20 MED ORDER — ROSUVASTATIN CALCIUM 40 MG PO TABS: 40.0000 mg | ORAL_TABLET | Freq: Every day | ORAL | 0 refills | Status: DC

## 2018-11-20 MED ORDER — METFORMIN HCL 1000 MG PO TABS: 1000.0000 mg | ORAL_TABLET | Freq: Two times a day (BID) | ORAL | 0 refills | Status: DC

## 2018-11-20 MED ORDER — ISOSORBIDE DINITRATE 30 MG PO TABS: 30.0000 mg | ORAL_TABLET | Freq: Every day | ORAL | 0 refills | Status: DC

## 2018-11-20 MED ORDER — CLOPIDOGREL BISULFATE 75 MG PO TABS: 75.0000 mg | ORAL_TABLET | Freq: Every day | ORAL | 0 refills | Status: DC

## 2018-11-20 MED ORDER — AMLODIPINE BESYLATE 10 MG PO TABS: 10.0000 mg | ORAL_TABLET | Freq: Every day | ORAL | 0 refills | Status: DC

## 2018-11-20 MED ORDER — ASPIRIN 81 MG PO CHEW: 81.0000 mg | CHEWABLE_TABLET | Freq: Every day | ORAL | 0 refills | Status: DC

## 2018-11-20 MED ORDER — LISINOPRIL 40 MG PO TABS: 40.0000 mg | ORAL_TABLET | Freq: Every day | ORAL | 0 refills | Status: DC

## 2018-11-20 NOTE — Progress Notes (Signed)
   CC: diabetes, HTN and CAD  HPI:Carrie Stevenson is a 68 y.o. female who presents for evaluation of diabetes. Please see individual problem based A/P for details.  PHQ-9: Based on the patients    Office Visit from 11/20/2018 in [facility]  PHQ-9 Total Score  2     score we have decided to monitor.  Past Medical History:  Diagnosis Date  . CAD (coronary artery disease), native coronary artery 04/25/2017   Hx of stent placement in [place].  . Diabetes mellitus without complication (HCC)   . Hypercholesteremia   . Hypertension    Review of Systems:  ROS negative except as per HPI.  Physical Exam: Vitals:   11/20/18 1337  BP: (!) 108/58  Pulse: 77  Temp: 99.5 F (37.5 C)  TempSrc: Oral  SpO2: 99%  Weight: 185 lb 12.8 oz (84.3 kg)  Height: 5\' 3"  (1.6 m)   General: A/O x4, in no acute distress, afebrile, nondiaphoretic Cardio: RRR, no mrg's Pulmonary: CTA bilaterally MSK: BLE nontender, nonedematous  Assessment & Plan:   See Encounters Tab for problem based charting.  Patient discussed with Dr. Cyndie Chime

## 2018-11-20 NOTE — Assessment & Plan Note (Signed)
  HTN: BP today 108/58 on her meds. She endorses close adherence to treatment today. She denied symptoms of an orthostatic nature.  Plan:  Continue amlodipine 10mg  daily Continue Lisinopril 40mg  daily Will need an annual CMP at her next PCP visit in 1-2 months

## 2018-11-20 NOTE — Assessment & Plan Note (Signed)
CAD: Continue ASA and Plavix and follow-up with Dr. Gwyneth RevelsKrienke At her next available appointment considering if duel therapy is appropriate. She denied chest pain, palpitations, chest pain with exertion or cough.  Continue ASA, plavix and rosuvastatin for secondary prevention  Will liver enzymes checked at next appointment

## 2018-11-20 NOTE — Patient Instructions (Signed)
FOLLOW-UP INSTRUCTIONS When: At Dr. Sylvester Harder next available appointment time in about 2-6 weeks if possible.  What to bring: All of your medications  Today we discussed your high blood pressure, history of a heart attack with stent placement 4-5 years ago and you mild diabetes. Please continue to take the medications you are prescribed and continue exercising and dieting.   Please return to see your primary doctor before April.  Thank you for your visit to the [facility] today. If you have any questions or concerns please call us at [phone].

## 2018-11-20 NOTE — Assessment & Plan Note (Signed)
  Diabetes: Patient with a 6.0% A1c today. She remains compliant with her Metformin. She denied dizziness, weakness, diaphoresis or confusion.   Plan: Continue Metformin Repeat A1c in 6 months

## 2018-11-20 NOTE — Progress Notes (Signed)
Medicine attending: Medical history, presenting problems, physical findings, and medications, reviewed with resident physician Dr Lawrence Harbrecht on the day of the patient visit and I concur with his evaluation and management plan. 

## 2018-12-17 ENCOUNTER — Other Ambulatory Visit: Payer: Self-pay | Admitting: Internal Medicine

## 2018-12-17 DIAGNOSIS — I2510 Atherosclerotic heart disease of native coronary artery without angina pectoris: Secondary | ICD-10-CM

## 2018-12-17 DIAGNOSIS — I10 Essential (primary) hypertension: Secondary | ICD-10-CM

## 2019-01-02 ENCOUNTER — Other Ambulatory Visit: Payer: Self-pay | Admitting: Internal Medicine

## 2019-01-02 DIAGNOSIS — I10 Essential (primary) hypertension: Secondary | ICD-10-CM

## 2019-01-05 ENCOUNTER — Other Ambulatory Visit: Payer: Self-pay | Admitting: Internal Medicine

## 2019-01-05 DIAGNOSIS — I10 Essential (primary) hypertension: Secondary | ICD-10-CM

## 2019-02-11 ENCOUNTER — Other Ambulatory Visit: Payer: Self-pay | Admitting: Internal Medicine

## 2019-02-11 DIAGNOSIS — I2510 Atherosclerotic heart disease of native coronary artery without angina pectoris: Secondary | ICD-10-CM

## 2019-02-11 DIAGNOSIS — I10 Essential (primary) hypertension: Secondary | ICD-10-CM

## 2019-02-15 ENCOUNTER — Other Ambulatory Visit: Payer: Self-pay | Admitting: Internal Medicine

## 2019-03-04 ENCOUNTER — Other Ambulatory Visit: Payer: Self-pay

## 2019-03-04 ENCOUNTER — Ambulatory Visit (INDEPENDENT_AMBULATORY_CARE_PROVIDER_SITE_OTHER): Payer: BLUE CROSS/BLUE SHIELD | Admitting: Internal Medicine

## 2019-03-04 DIAGNOSIS — I10 Essential (primary) hypertension: Secondary | ICD-10-CM | POA: Diagnosis not present

## 2019-03-04 DIAGNOSIS — I2510 Atherosclerotic heart disease of native coronary artery without angina pectoris: Secondary | ICD-10-CM

## 2019-03-04 DIAGNOSIS — E119 Type 2 diabetes mellitus without complications: Secondary | ICD-10-CM

## 2019-03-04 DIAGNOSIS — Z0000 Encounter for general adult medical examination without abnormal findings: Secondary | ICD-10-CM

## 2019-03-04 MED ORDER — CARVEDILOL 25 MG PO TABS: 25.0000 mg | ORAL_TABLET | Freq: Two times a day (BID) | ORAL | 3 refills | Status: DC

## 2019-03-04 NOTE — Assessment & Plan Note (Signed)
Patient had a colonoscopy in [place], she is not sure when exactly this occurred.  Advised patient to obtain records from her primary care physician and I will place a referral for a colonoscopy.  Elective procedures are still limited at this time due to the coronavirus pandemic, can complete this when they are able to schedule this.  -Referral to GI -Advised patient to obtain records from prior PCP and send to [place]

## 2019-03-04 NOTE — Progress Notes (Signed)
  [facility] Telephone Encounter Continuity Care Appointment  HPI:   This telephone encounter was created for Ms. Carrie Stevenson on 03/04/2019 for the following purpose/cc hypertension, diabetes, coronary artery disease.   Past Medical History:  Past Medical History:  Diagnosis Date  . CAD (coronary artery disease), native coronary artery 04/25/2017   Hx of stent placement in [place].  . Diabetes mellitus without complication (HCC)   . Hypercholesteremia   . Hypertension       ROS:   Denied fevers, chills, nausea, vomiting, chest pain, shortness of breath, headaches, lightheadedness, dizziness, fatigue, or other issues.   Assessment / Plan / Recommendations:   Please see A&P under problem oriented charting for assessment of the patient's acute and chronic medical conditions.   As always, pt is advised that if symptoms worsen or new symptoms arise, they should go to an urgent care facility or to to ER for further evaluation.   Consent and Medical Decision Making:   Patient discussed with Dr. Criselda Peaches  This is a telephone encounter between Carrie Stevenson and Claudean Severance on 03/04/2019 for hypertension, diabetes, coronary artery disease. The visit was conducted with the patient located at home and Claudean Severance at [facility]. The patient's identity was confirmed using their DOB and current address. The patient has consented to being evaluated through a telephone encounter and understands the associated risks (an examination cannot be done and the patient may need to come in for an appointment) / benefits (allows the patient to remain at home, decreasing exposure to coronavirus). I personally spent 15 minutes on medical discussion.

## 2019-03-04 NOTE — Assessment & Plan Note (Signed)
Patient is currently on amlodipine 10 mg daily, lisinopril 40 mg daily, and carvedilol 25 mg twice a day.  She reports that she is able to check her blood pressures at home and it is in the normal range, around 120/80.  He denies any issues getting her medications, denies any side effects of the medications.  She denies any chest pain, lightheadedness, dizziness, fatigue, or headaches.  Blood pressure on her last visit was normal.  Plan: -Continue lisinopril 40 mg daily -Continue amlodipine 10 mg daily -Continue Coreg 25 mg twice daily -We will recheck BMP on next in-person visit

## 2019-03-04 NOTE — Assessment & Plan Note (Signed)
Patient had a history of coronary artery disease status post stent placement in 2010 in [place], she is currently on aspirin and Plavix.  She does report that when she was in [place] she had been taken off of Plavix for a short while due to nosebleeds, however was restarted before leaving [place].  She denies any bleeding episodes at this time.  She denies any chest pain, shortness of breath, or any other symptoms.  She has seen a cardiologist here in [place] and last saw them last year, no changes were made to her medications at that time.  Given the timing of her stent does not seem that she needs to be on dual antiplatelet therapy.  We will stop aspirin at this time and have her follow-up with cardiology.   Plan: -Discontinue aspirin -Continue Plavix -Continue rosuvastatin -Follow-up with cardiology

## 2019-03-04 NOTE — Assessment & Plan Note (Signed)
Patient is currently on metformin 1000 mg twice daily, she states that she does not check her blood pressures at home.  She does report that she has been increasing her fluid intake, however states that she is trying to increase it, is now drinking half a gallon to 1 gallon per day.  She states that around the time of her increase fluid intake, around 2 months ago, she has been having some looser stools, this occurs mostly in the morning.  She denies any other symptoms at this time.  Patient is on a high dose of metformin however due to the relationship of increased fluid intake a think that the loose stools were more related to that rather than a side effect of the metformin.  We will check her A1c on her next visit and may decrease metformin dose at that time.  -Continue metformin 1000 mg twice daily -Repeat A1c on follow-up in 3 months

## 2019-03-05 ENCOUNTER — Other Ambulatory Visit: Payer: Self-pay | Admitting: Internal Medicine

## 2019-03-08 NOTE — Progress Notes (Signed)
Internal Medicine Clinic Attending  Case discussed with Dr. Gwyneth Revels after the resident saw the patient.  We reviewed the resident's history, telephone conversation and pertinent patient test results.  I agree with the assessment, diagnosis, and plan of care documented in the resident's note.

## 2019-03-12 ENCOUNTER — Other Ambulatory Visit: Payer: Self-pay | Admitting: Internal Medicine

## 2019-03-12 DIAGNOSIS — I10 Essential (primary) hypertension: Secondary | ICD-10-CM

## 2019-03-16 NOTE — Telephone Encounter (Addendum)
Previous refill failed to transmit, will send to pcp for consideration. Please advise.Marland KitchenMarland KitchenKingsley Spittle Cassady5/26/20204:41 PM

## 2019-04-04 ENCOUNTER — Other Ambulatory Visit: Payer: Self-pay | Admitting: Internal Medicine

## 2019-04-04 DIAGNOSIS — I2510 Atherosclerotic heart disease of native coronary artery without angina pectoris: Secondary | ICD-10-CM

## 2019-04-07 ENCOUNTER — Telehealth: Payer: Self-pay | Admitting: *Deleted

## 2019-04-07 NOTE — Telephone Encounter (Signed)
[facility] FOR MEDICAL RECORDS ([facility] ) TO FAX CONSULT NOTE June 4.2020  TO [facility] [phone].

## 2019-04-12 ENCOUNTER — Other Ambulatory Visit: Payer: Self-pay | Admitting: Internal Medicine

## 2019-04-12 DIAGNOSIS — I2510 Atherosclerotic heart disease of native coronary artery without angina pectoris: Secondary | ICD-10-CM

## 2019-05-06 ENCOUNTER — Other Ambulatory Visit: Payer: Self-pay | Admitting: Internal Medicine

## 2019-05-06 DIAGNOSIS — E119 Type 2 diabetes mellitus without complications: Secondary | ICD-10-CM

## 2019-05-10 ENCOUNTER — Other Ambulatory Visit: Payer: Self-pay | Admitting: Internal Medicine

## 2019-05-10 DIAGNOSIS — I10 Essential (primary) hypertension: Secondary | ICD-10-CM

## 2019-05-10 DIAGNOSIS — I2510 Atherosclerotic heart disease of native coronary artery without angina pectoris: Secondary | ICD-10-CM

## 2019-05-11 ENCOUNTER — Other Ambulatory Visit: Payer: Self-pay | Admitting: Internal Medicine

## 2019-07-10 ENCOUNTER — Other Ambulatory Visit: Payer: Self-pay | Admitting: Internal Medicine

## 2019-07-10 DIAGNOSIS — I2510 Atherosclerotic heart disease of native coronary artery without angina pectoris: Secondary | ICD-10-CM

## 2019-07-20 ENCOUNTER — Other Ambulatory Visit: Payer: Self-pay | Admitting: Internal Medicine

## 2019-07-20 DIAGNOSIS — I2510 Atherosclerotic heart disease of native coronary artery without angina pectoris: Secondary | ICD-10-CM

## 2019-07-21 NOTE — Telephone Encounter (Signed)
Next appt scheduled 11/2 with PCP. 

## 2019-07-29 ENCOUNTER — Other Ambulatory Visit: Payer: Self-pay | Admitting: Internal Medicine

## 2019-07-29 DIAGNOSIS — E119 Type 2 diabetes mellitus without complications: Secondary | ICD-10-CM

## 2019-08-10 ENCOUNTER — Other Ambulatory Visit: Payer: Self-pay | Admitting: Internal Medicine

## 2019-08-10 DIAGNOSIS — I2510 Atherosclerotic heart disease of native coronary artery without angina pectoris: Secondary | ICD-10-CM

## 2019-08-10 DIAGNOSIS — I10 Essential (primary) hypertension: Secondary | ICD-10-CM

## 2019-08-10 LAB — HM DIABETES EYE EXAM

## 2019-08-10 NOTE — Telephone Encounter (Signed)
Next appt scheduled 11/2 with PCP. 

## 2019-08-11 ENCOUNTER — Encounter: Payer: Self-pay | Admitting: *Deleted

## 2019-08-23 ENCOUNTER — Encounter: Payer: Self-pay | Admitting: Internal Medicine

## 2019-08-23 ENCOUNTER — Other Ambulatory Visit: Payer: Self-pay

## 2019-08-23 ENCOUNTER — Ambulatory Visit: Payer: BC Managed Care – PPO | Admitting: Internal Medicine

## 2019-08-23 VITALS — BP 140/70 | HR 74 | Temp 98.2°F | Ht 63.5 in | Wt 177.0 lb

## 2019-08-23 DIAGNOSIS — E119 Type 2 diabetes mellitus without complications: Secondary | ICD-10-CM | POA: Diagnosis not present

## 2019-08-23 DIAGNOSIS — G2581 Restless legs syndrome: Secondary | ICD-10-CM | POA: Diagnosis not present

## 2019-08-23 DIAGNOSIS — Z7902 Long term (current) use of antithrombotics/antiplatelets: Secondary | ICD-10-CM

## 2019-08-23 DIAGNOSIS — Z0000 Encounter for general adult medical examination without abnormal findings: Secondary | ICD-10-CM

## 2019-08-23 DIAGNOSIS — Z7984 Long term (current) use of oral hypoglycemic drugs: Secondary | ICD-10-CM

## 2019-08-23 DIAGNOSIS — R5383 Other fatigue: Secondary | ICD-10-CM | POA: Diagnosis not present

## 2019-08-23 DIAGNOSIS — I2510 Atherosclerotic heart disease of native coronary artery without angina pectoris: Secondary | ICD-10-CM

## 2019-08-23 DIAGNOSIS — Z79899 Other long term (current) drug therapy: Secondary | ICD-10-CM

## 2019-08-23 DIAGNOSIS — G4733 Obstructive sleep apnea (adult) (pediatric): Secondary | ICD-10-CM

## 2019-08-23 DIAGNOSIS — I10 Essential (primary) hypertension: Secondary | ICD-10-CM | POA: Diagnosis not present

## 2019-08-23 DIAGNOSIS — Z9989 Dependence on other enabling machines and devices: Secondary | ICD-10-CM

## 2019-08-23 DIAGNOSIS — Z87891 Personal history of nicotine dependence: Secondary | ICD-10-CM

## 2019-08-23 DIAGNOSIS — Z13820 Encounter for screening for osteoporosis: Secondary | ICD-10-CM

## 2019-08-23 HISTORY — DX: Restless legs syndrome: G25.81

## 2019-08-23 LAB — GLUCOSE, CAPILLARY: Glucose-Capillary: 117 mg/dL — ABNORMAL HIGH (ref 70–99)

## 2019-08-23 LAB — POCT GLYCOSYLATED HEMOGLOBIN (HGB A1C): Hemoglobin A1C: 5.8 % — AB (ref 4.0–5.6)

## 2019-08-23 MED ORDER — METFORMIN HCL 1000 MG PO TABS: 1000.0000 mg | ORAL_TABLET | Freq: Every day | ORAL | 0 refills | Status: DC

## 2019-08-23 NOTE — Assessment & Plan Note (Addendum)
Patient has history of CAD status post stent in 2010, currently on Plavix 75 mg daily.  She reported that she had issues with nosebleeds previously however denies any issues at this time.  Denies any chest pain, shortness of breath, or other symptoms.    -Continue Plavix 5 mg daily -Continue rosuvastatin daily -Continue to follow with cardiology

## 2019-08-23 NOTE — Assessment & Plan Note (Signed)
Ordered DEXA scan and mammogram. 

## 2019-08-23 NOTE — Assessment & Plan Note (Addendum)
Patient reports that she has been having bilateral lower extremity leg cramping and weakness.  It occurs about 1-2 times per month, almost always at night.  She occasionally will have some tingling in her legs and will need to get up to walk around to resolve her issues.  She denies any other weakness or numbness or tingling or other extremities.  She reports that she has tried stretching the area which helps a little bit.  She also endorses some increased ice intake and ice cravings.  Symptoms do seem to be concerning for restless leg syndrome. Given her fatigue other concerns include thyroid disease, electrolyte abnormalities, and anemia.   -CBC, iron, ferritin -BMP -TSH -Folate -Advised patient to stretch daily, especially before bed

## 2019-08-23 NOTE — Assessment & Plan Note (Signed)
Patient reports that she has been compliant with her CPAP nightly, she states that she has not had the settings adjusted for about 5 years.  She states that she does not feel like her settings are correct and is asking for adjustments.  She denies any daytime headache's, daytime sleepiness, or snoring.  She does report some occasional generalized fatigue.  -Referral for sleep clinic, to adjust medication and provide education

## 2019-08-23 NOTE — Progress Notes (Signed)
CC: Fatigue, diabetes, HTN, sleep apnea  HPI:  [name] is a 68 y.o.  with a PMH listed below presenting for fatigue, diabetes, HTN, sleep apnea. She reports that she has been having some increased fatigue in her lower extremities, also endorses some bilateral lower extremity cramping.   Please see A&P for status of the patient's chronic medical conditions  Past Medical History:  Diagnosis Date  . CAD (coronary artery disease), native coronary artery 04/25/2017   Hx of stent placement in [place].  . Diabetes mellitus without complication (HCC)   . Hypercholesteremia   . Hypertension    Review of Systems: Refer to history of present illness and assessment and plans for pertinent review of systems, all others reviewed and negative.  Physical Exam: Vitals:   08/23/19 1331  BP: 140/70  Pulse: 74  Temp: 98.2 F (36.8 C)  TempSrc: Oral  SpO2: 100%  Weight: 177 lb (80.3 kg)  Height: 5' 3.5" (1.613 m)   Physical Exam  Constitutional: She is oriented to person, place, and time and well-developed, well-nourished, and in no distress.  HENT:  Head: Normocephalic and atraumatic.  Eyes: Pupils are equal, round, and reactive to light. Conjunctivae and EOM are normal.  Neck: Normal range of motion. Neck supple. No thyromegaly present.  Cardiovascular: Normal rate, regular rhythm and normal heart sounds.  Pulmonary/Chest: Effort normal and breath sounds normal. No respiratory distress.  Abdominal: Soft. Bowel sounds are normal. She exhibits no distension.  Neurological: She is alert and oriented to person, place, and time.  Skin: Skin is warm and dry.  Psychiatric: Mood and affect normal.    Social History   Socioeconomic History  . Marital status: Single    Spouse name: Not on file  . Number of children: Not on file  . Years of education: Not on file  . Highest education level: Not on file  Occupational History  . Not on file  Social Needs  . Financial resource strain:  Not on file  . Food insecurity    Worry: Not on file    Inability: Not on file  . Transportation needs    Medical: Not on file    Non-medical: Not on file  Tobacco Use  . Smoking status: Former Smoker    Quit date: 04/26/2007    Years since quitting: 12.3  . Smokeless tobacco: Never Used  Substance and Sexual Activity  . Alcohol use: Yes    Alcohol/week: 0.0 standard drinks    Comment: 1 glass of wine per month  . Drug use: No  . Sexual activity: Not on file  Lifestyle  . Physical activity    Days per week: Not on file    Minutes per session: Not on file  . Stress: Not on file  Relationships  . Social Musician on phone: Not on file    Gets together: Not on file    Attends religious service: Not on file    Active member of club or organization: Not on file    Attends meetings of clubs or organizations: Not on file    Relationship status: Not on file  . Intimate partner violence    Fear of current or ex partner: Not on file    Emotionally abused: Not on file    Physically abused: Not on file    Forced sexual activity: Not on file  Other Topics Concern  . Not on file  Social History Narrative  . Not on  file    Family History  Problem Relation Age of Onset  . Lupus Mother   . Lupus Sister   . Cancer Sister   . Lupus Brother   . Cerebral aneurysm Son     Assessment & Plan:   See Encounters Tab for problem based charting.  Patient discussed with Dr. Philipp Ovens

## 2019-08-23 NOTE — Patient Instructions (Signed)
Ms. Carrie Stevenson,  It was a pleasure to see you today. Thank you for coming in.   Today we discussed your fatigue and leg weakness. In regards to this please continue to stretch your legs at night before going to bed. I have checked some labs and will contact you if they are abnormal.   We also discussed your diabetes. You can decrease your metformin to 1000 mg daily. Please continue to check your blood sugars at home.    I have put in a referral for pulmonology to discuss repeating your sleep study and adjusting your CPAP.  I have put in a referral for a bone scan and a mammogram.   Please return to clinic in 3 months or sooner if needed.   Thank you again for coming in.   [name].D.

## 2019-08-23 NOTE — Assessment & Plan Note (Signed)
Patient is currently on lisinopril 40 mg daily, carvedilol 25 mg twice daily, and amlodipine 10 mg daily.  She denies any issues taking her medications, denies any side effects of medications.  Blood pressure today was 140/70, mildly above goal.  We will hold off on adjusting medications at this time, consider increasing on next visit if blood pressure still elevated.  -Continue lisinopril 40 mg daily -Continue carvedilol 25 mg twice daily -Continue amlodipine 10 mg daily -Repeat BMP

## 2019-08-23 NOTE — Assessment & Plan Note (Signed)
Patient is currently on metformin 1000 mg twice daily, she does not check her blood sugars at home.  She denies any polyuria, polydipsia, or polyphagia.  She states that she does not feel like she needs to have metformin twice a day.  A1c today is 5.8.  Given her low A1c it seems reasonable to decrease her Metformin to 1000 mg daily.  -Decrease metformin 1000 mg daily -Return to clinic in 3 months

## 2019-08-24 ENCOUNTER — Other Ambulatory Visit: Payer: Self-pay | Admitting: Internal Medicine

## 2019-08-24 ENCOUNTER — Telehealth: Payer: Self-pay | Admitting: *Deleted

## 2019-08-24 LAB — FOLATE RBC
Folate, Hemolysate: 403 ng/mL
Folate, RBC: 1538 ng/mL (ref >498)

## 2019-08-24 LAB — CBC
Hematocrit: 26.2 % — ABNORMAL LOW (ref 34.0–46.6)
Hemoglobin: 7 g/dL — CL (ref 11.1–15.9)
MCH: 18.2 pg — ABNORMAL LOW (ref 26.6–33.0)
MCHC: 26.7 g/dL — ABNORMAL LOW (ref 31.5–35.7)
MCV: 68 fL — ABNORMAL LOW (ref 79–97)
Platelets: 366 10*3/uL (ref 150–450)
RBC: 3.84 x10E6/uL (ref 3.77–5.28)
RDW: 18.9 % — ABNORMAL HIGH (ref 11.7–15.4)
WBC: 7.9 10*3/uL (ref 3.4–10.8)

## 2019-08-24 LAB — BASIC METABOLIC PANEL
BUN/Creatinine Ratio: 12 (ref 12–28)
BUN: 11 mg/dL (ref 8–27)
CO2: 21 mmol/L (ref 20–29)
Calcium: 9.4 mg/dL (ref 8.7–10.3)
Chloride: 109 mmol/L — ABNORMAL HIGH (ref 96–106)
Creatinine, Ser: 0.93 mg/dL (ref 0.57–1.00)
GFR calc Af Amer: 74 mL/min/{1.73_m2} (ref >59)
GFR calc non Af Amer: 64 mL/min/{1.73_m2} (ref >59)
Glucose: 122 mg/dL — ABNORMAL HIGH (ref 65–99)
Potassium: 4.5 mmol/L (ref 3.5–5.2)
Sodium: 143 mmol/L (ref 134–144)

## 2019-08-24 LAB — IRON AND TIBC
Iron Saturation: 4 % — CL (ref 15–55)
Iron: 13 ug/dL — ABNORMAL LOW (ref 27–139)
Total Iron Binding Capacity: 356 ug/dL (ref 250–450)
UIBC: 343 ug/dL (ref 118–369)

## 2019-08-24 LAB — TSH: TSH: 1.32 u[IU]/mL (ref 0.450–4.500)

## 2019-08-24 LAB — FERRITIN: Ferritin: 4 ng/mL — ABNORMAL LOW (ref 15–150)

## 2019-08-24 NOTE — Progress Notes (Signed)
Internal Medicine Clinic Attending  Case discussed with Dr. Krienke at the time of the visit.  We reviewed the resident's history and exam and pertinent patient test results.  I agree with the assessment, diagnosis, and plan of care documented in the resident's note.    

## 2019-08-24 NOTE — Telephone Encounter (Signed)
-----   Message from [name], MD sent at 08/24/2019  4:54 PM EST ----- Hello, Patient needs a follow up on Thursday or Friday to repeat labs. Thank you.  Carrie Stevenson

## 2019-08-24 NOTE — Progress Notes (Signed)
Contacted patient regarding her lab results. CBC showed a Hgb of 7, down from 13.9 2 years ago, MCV 68. Iron studies showed Iron 12, TIBC 356, Ferritin 4, and iron saturation 4. She denied any bleeding sources, does endorse some black stools prior to her colonoscopy in 07/02/19, however no dark stools since then. We discussed that I recommended that she gets admitted for symptomatic anemia however patient is reluctant to do this due to family concerns. Her son is currently in the hospital and she is trying to arrange his disposition planning. Discussed that this is a very dangerous level and since she is symptomatic it is important for her to come in to get treatment. She expressed understanding, she reported that she may be able to be admitted in 2-3 days if she is able to find someone to help her. We discussed that we should at least have her come in for repeat labs if she is unable to be admitted, she is in agreement with this. Advised her that it she starts having shortness of breath, chest pain, worsening fatigue, or sources of bleeding to come in to the ER to be treated, she expressed understanding.   -RTC in 2-3 days to repeat labs and have further discussion about admission -Advised patient to hold her Plavix for now -If still declining admission then would need blood transfusion and Feraheme orders -[place], did confirm that colonoscopy was normal, they will contact her as well. May need to consider EGD.

## 2019-08-24 NOTE — Telephone Encounter (Signed)
Called pt per dr Sherry Ruffing and scheduled [facility] appt for thurs 11/5at 0845

## 2019-08-26 ENCOUNTER — Encounter: Payer: Self-pay | Admitting: Internal Medicine

## 2019-08-26 ENCOUNTER — Other Ambulatory Visit: Payer: Self-pay

## 2019-08-26 ENCOUNTER — Ambulatory Visit: Payer: BC Managed Care – PPO | Admitting: Internal Medicine

## 2019-08-26 VITALS — BP 169/98 | HR 78 | Temp 98.4°F | Ht 63.5 in | Wt 177.3 lb

## 2019-08-26 DIAGNOSIS — D509 Iron deficiency anemia, unspecified: Secondary | ICD-10-CM | POA: Diagnosis not present

## 2019-08-26 DIAGNOSIS — G2581 Restless legs syndrome: Secondary | ICD-10-CM | POA: Diagnosis not present

## 2019-08-26 DIAGNOSIS — Z79899 Other long term (current) drug therapy: Secondary | ICD-10-CM

## 2019-08-26 DIAGNOSIS — E119 Type 2 diabetes mellitus without complications: Secondary | ICD-10-CM | POA: Diagnosis not present

## 2019-08-26 DIAGNOSIS — I10 Essential (primary) hypertension: Secondary | ICD-10-CM | POA: Diagnosis not present

## 2019-08-26 DIAGNOSIS — Z90710 Acquired absence of both cervix and uterus: Secondary | ICD-10-CM

## 2019-08-26 LAB — CBC
HCT: 27.9 % — ABNORMAL LOW (ref 36.0–46.0)
Hemoglobin: 7.7 g/dL — ABNORMAL LOW (ref 12.0–15.0)
MCH: 18.6 pg — ABNORMAL LOW (ref 26.0–34.0)
MCHC: 27.6 g/dL — ABNORMAL LOW (ref 30.0–36.0)
MCV: 67.2 fL — ABNORMAL LOW (ref 80.0–100.0)
Platelets: 375 10*3/uL (ref 150–400)
RBC: 4.15 MIL/uL (ref 3.87–5.11)
RDW: 20.8 % — ABNORMAL HIGH (ref 11.5–15.5)
WBC: 8.7 10*3/uL (ref 4.0–10.5)
nRBC: 0 % (ref 0.0–0.2)

## 2019-08-26 MED ORDER — FERROUS SULFATE 325 (65 FE) MG PO TABS: 325.0000 mg | ORAL_TABLET | Freq: Every day | ORAL | 1 refills | Status: DC

## 2019-08-26 NOTE — Progress Notes (Signed)
   CC: Anemic and iron deficient on blood work  HPI: Patient is a 68 year old female with past medical history of hypertension and type 2 diabetes mellitus who presents after blood work for restless leg syndrome revealed iron deficiency anemia.  Ms.Marquasha Nudelman is a 68 y.o.   Past Medical History:  Diagnosis Date  . CAD (coronary artery disease), native coronary artery 04/25/2017   Hx of stent placement in [place].  . Diabetes mellitus without complication ([place])   . Hypercholesteremia   . Hypertension    Review of Systems:   Review of Systems  Constitutional: Negative for chills and fever.  HENT: Negative for congestion.   Respiratory: Negative for cough and shortness of breath.   Cardiovascular: Negative for chest pain.  Gastrointestinal: Negative for abdominal pain, blood in stool, constipation, diarrhea, heartburn, melena, nausea and vomiting.  Genitourinary: Negative for dysuria, frequency and urgency.  All other systems reviewed and are negative.   Physical Exam:  Vitals:   08/26/19 0839 08/26/19 0844  BP: (!) 181/100 (!) 169/98  Pulse: 83 78  Temp: 98.4 F (36.9 C)   TempSrc: Oral   SpO2: 100%   Weight: 177 lb 4.8 oz (80.4 kg)   Height: 5' 3.5" (1.613 m)    Physical Exam  Constitutional: She is well-developed, well-nourished, and in no distress.  HENT:  Head: Normocephalic and atraumatic.  Eyes: EOM are normal. Right eye exhibits no discharge. Left eye exhibits no discharge.  Neck: Normal range of motion. No tracheal deviation present.  Cardiovascular: Normal rate and regular rhythm. Exam reveals no gallop and no friction rub.  No murmur heard. Pulmonary/Chest: Effort normal and breath sounds normal. No respiratory distress. She has no wheezes. She has no rales.  Abdominal: Soft. There is abdominal tenderness.  Musculoskeletal: Normal range of motion.        General: No tenderness, deformity or edema.  Neurological: She is alert. Coordination normal.  Skin: Skin  is warm and dry. No rash noted. She is not diaphoretic. No erythema.  Psychiatric: Memory and judgment normal.     Assessment & Plan:   See Encounters Tab for problem based charting.  Patient seen and discussed with Dr. Dareen Piano

## 2019-08-26 NOTE — Assessment & Plan Note (Signed)
Blood pressure elevated to 181/101 and 169/98 at visit today, 140/70 at last visit.  Patient reports taking medication daily, but missed doses of all antihypertensive medications this morning. * Continue lisinopril 40 mg daily + carvedilol 25 mg twice daily + amlodipine 10 mg daily * BMP at last visit without evidence for kidney dysfunction or significant electrolyte abnormality

## 2019-08-26 NOTE — Assessment & Plan Note (Signed)
Restless leg syndrome likely secondary to iron deficiency.  Ferritin of 4 and iron of 13.  Please see A/P note for iron deficiency anemia for further details.

## 2019-08-26 NOTE — Assessment & Plan Note (Addendum)
CBC and iron studies were checked due to restless leg syndrome.  Hemoglobin of 7.0.  Iron studies consistent with iron deficiency anemia with ferritin of 4 and iron of 13.  No obvious source for bleeding on history.  Per patient, colonoscopy performed 2 months ago was normal other than hemorrhoids.  Patient denies vaginal bleeding and reports she has had a hysterectomy.  Patient denies stomach pain or reflux.  She reports that she usually eats a good amount of red meat but for the past few months has been eating mostly vegetables.  However, this diet modification over such a short timeframe would be unlikely to cause this level of anemia.  There is likely a gastrointestinal source for the blood loss. *Patient with gastroenterology appointment this afternoon. I anticipate that they will consider an EGD for further evaluation. *We will repeat CBC and consider outpatient transfusion *Start ferrous sulfate 325 mg daily

## 2019-08-26 NOTE — Patient Instructions (Addendum)
You were seen for follow-up on your lab work which showed low iron and hemoglobin.  It is likely that you are losing blood in your GI tract.  At your gastroenterology appointment this afternoon, they will be able to further work-up to try to find the source of your blood loss.  We checked your blood levels again today.  If they are too low, we will call you and schedule you to get a blood transfusion.  We have also sent a prescription for iron supplements which we want you to take once daily.  Your blood pressure was a little elevated at this visit but this is likely because you are unable to take your blood pressure medications this morning.  If you start to feel chest pain, shortness of breath, very lightheaded please call the clinic or seek medical attention immediately.  Thank you for allowing [place] to be part of your medical care.

## 2019-08-27 NOTE — Progress Notes (Signed)
Internal Medicine Clinic Attending  I saw and evaluated the patient.  I personally confirmed the key portions of the history and exam documented by Dr. MacLean and I reviewed pertinent patient test results.  The assessment, diagnosis, and plan were formulated together and I agree with the documentation in the resident's note.  

## 2019-09-01 DIAGNOSIS — Z13820 Encounter for screening for osteoporosis: Secondary | ICD-10-CM | POA: Insufficient documentation

## 2019-09-10 ENCOUNTER — Encounter: Payer: Self-pay | Admitting: *Deleted

## 2019-09-27 NOTE — Progress Notes (Deleted)
Cardiology Office Note    Date:  09/28/2019   ID:  Carrie Stevenson, Carrie Stevenson 01-28-1951, MRN [id number]  PCP:  [name], MD  Cardiologist: Larae Grooms, MD EPS: None  No chief complaint on file.   History of Present Illness:  Carrie Stevenson is a 68 y.o. female with history of CAD status post STEMI  in [place] 2017 treated with stent to the RCA.  She had severe nausea and vomiting at that time no chest pain.  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  Also has hypertension, HLD, DM type II, OSA on CPAP  Patient moved to [facility] 2017 to be closer to her children.  Saw Dr. Irish Lack 02/16/2018 and was doing well.   Past Medical History:  Diagnosis Date  . CAD (coronary artery disease), native coronary artery 04/25/2017   Hx of stent placement in [place].  . Diabetes mellitus without complication ([facility])   . Hypercholesteremia   . Hypertension     Past Surgical History:  Procedure Laterality Date  . ABDOMINAL HYSTERECTOMY    . CORONARY ANGIOPLASTY WITH STENT PLACEMENT      Current Medications: No outpatient medications have been marked as taking for the 09/28/19 encounter (Appointment) with Imogene Burn, PA-C.     Allergies:   Patient has no known allergies.   Social History   Socioeconomic History  . Marital status: Single    Spouse name: Not on file  . Number of children: Not on file  . Years of education: Not on file  . Highest education level: Not on file  Occupational History  . Not on file  Social Needs  . Financial resource strain: Not on file  . Food insecurity    Worry: Not on file    Inability: Not on file  . Transportation needs    Medical: Not on file    Non-medical: Not on file  Tobacco Use  . Smoking status: Former Smoker    Quit date: 04/26/2007    Years since quitting: 12.4  . Smokeless tobacco: Never Used  Substance and Sexual Activity  . Alcohol use: Yes    Alcohol/week: 0.0  standard drinks    Comment: 1 glass of wine per month  . Drug use: No  . Sexual activity: Not on file  Lifestyle  . Physical activity    Days per week: Not on file    Minutes per session: Not on file  . Stress: Not on file  Relationships  . Social Herbalist on phone: Not on file    Gets together: Not on file    Attends religious service: Not on file    Active member of club or organization: Not on file    Attends meetings of clubs or organizations: Not on file    Relationship status: Not on file  Other Topics Concern  . Not on file  Social History Narrative  . Not on file     Family History:  The patient's ***family history includes Cancer in her sister; Cerebral aneurysm in her son; Lupus in her brother, mother, and sister.   ROS:   Please see the history of present illness.    ROS All other systems reviewed and are negative.   PHYSICAL EXAM:   VS:  LMP  (LMP Unknown)   Physical Exam  GEN: Well nourished, well developed, in no acute distress  HEENT: normal  Neck: no JVD, carotid  bruits, or masses Cardiac:RRR; no murmurs, rubs, or gallops  Respiratory:  clear to auscultation bilaterally, normal work of breathing GI: soft, nontender, nondistended, + BS Ext: without cyanosis, clubbing, or edema, Good distal pulses bilaterally MS: no deformity or atrophy  Skin: warm and dry, no rash Neuro:  Alert and Oriented x 3, Strength and sensation are intact Psych: euthymic mood, full affect  Wt Readings from Last 3 Encounters:  08/26/19 177 lb 4.8 oz (80.4 kg)  08/23/19 177 lb (80.3 kg)  11/20/18 185 lb 12.8 oz (84.3 kg)      Studies/Labs Reviewed:   EKG:  EKG is*** ordered today.  The ekg ordered today demonstrates ***  Recent Labs: 08/23/2019: BUN 11; Creatinine, Ser 0.93; Potassium 4.5; Sodium 143; TSH 1.320 08/26/2019: Hemoglobin 7.7; Platelets 375   Lipid Panel    Component Value Date/Time   CHOL 88 (L) 03/06/2018 1119   TRIG 56 03/06/2018 1119   HDL  36 (L) 03/06/2018 1119   CHOLHDL 2.4 03/06/2018 1119   LDLCALC 41 03/06/2018 1119    Additional studies/ records that were reviewed today include:     ASSESSMENT:    No diagnosis found.   PLAN:  In order of problems listed above:  CAD status post STEMI treated with stent to the RCA 2017 in [place] with residual moderate circumflex disease treated medically-no inducible ischemia on stress test 2017  Essential hypertension  Hyperlipidemia LDL 41 02/2018  Iron deficiency anemia hemoglobin 7.7 08/26/2019 was sent to GI  OSA on CPAP  Medication Adjustments/Labs and Tests Ordered: Current medicines are reviewed at length with the patient today.  Concerns regarding medicines are outlined above.  Medication changes, Labs and Tests ordered today are listed in the Patient Instructions below. There are no Patient Instructions on file for this visit.   Elson Clan, PA-C  09/28/2019 12:28 PM    [facility] [address] [place], [place], [place]  [address] Phone: [phone]; Fax: [phone]

## 2019-09-28 ENCOUNTER — Ambulatory Visit: Payer: BLUE CROSS/BLUE SHIELD | Admitting: Physician Assistant

## 2019-10-07 NOTE — Addendum Note (Signed)
Addended by: Hulan Fray on: 10/07/2019 03:31 PM   Modules accepted: Orders

## 2019-10-29 ENCOUNTER — Other Ambulatory Visit: Payer: Self-pay | Admitting: Internal Medicine

## 2019-10-29 DIAGNOSIS — E119 Type 2 diabetes mellitus without complications: Secondary | ICD-10-CM

## 2019-11-12 ENCOUNTER — Other Ambulatory Visit: Payer: Self-pay | Admitting: Internal Medicine

## 2019-11-12 DIAGNOSIS — I10 Essential (primary) hypertension: Secondary | ICD-10-CM

## 2019-11-12 DIAGNOSIS — I2510 Atherosclerotic heart disease of native coronary artery without angina pectoris: Secondary | ICD-10-CM

## 2019-11-15 ENCOUNTER — Other Ambulatory Visit: Payer: Self-pay | Admitting: Internal Medicine

## 2019-11-26 ENCOUNTER — Other Ambulatory Visit: Payer: Self-pay

## 2019-11-26 ENCOUNTER — Ambulatory Visit
Admission: RE | Admit: 2019-11-26 | Discharge: 2019-11-26 | Disposition: A | Discharge status: Home / Self Care | Payer: BC Managed Care – PPO | Source: Ambulatory Visit | Attending: Internal Medicine | Admitting: Internal Medicine

## 2019-11-26 DIAGNOSIS — Z0000 Encounter for general adult medical examination without abnormal findings: Secondary | ICD-10-CM

## 2019-11-26 DIAGNOSIS — Z13820 Encounter for screening for osteoporosis: Secondary | ICD-10-CM

## 2019-12-07 ENCOUNTER — Encounter: Payer: Self-pay | Admitting: Internal Medicine

## 2019-12-11 ENCOUNTER — Ambulatory Visit: Payer: BC Managed Care – PPO | Attending: Internal Medicine

## 2019-12-11 DIAGNOSIS — Z23 Encounter for immunization: Secondary | ICD-10-CM | POA: Insufficient documentation

## 2019-12-11 NOTE — Progress Notes (Signed)
   Covid-19 Vaccination Clinic  Name:  Carrie Stevenson    MRN: [id number] DOB: December 09, 1950  12/11/2019  [name] was observed post Covid-19 immunization for 15 minutes without incidence. She was provided with Vaccine Information Sheet and instruction to access the V-Safe system.   [name] was instructed to call 911 with any severe reactions post vaccine: Marland Kitchen Difficulty breathing  . Swelling of your face and throat  . A fast heartbeat  . A bad rash all over your body  . Dizziness and weakness    Immunizations Administered    Name Date Dose VIS Date Route   Pfizer COVID-19 Vaccine 12/11/2019  9:43 AM 0.3 mL 10/01/2019 Intramuscular   Manufacturer: [employer], [employer]   Lot: [id number]   NDC: [address]-6

## 2020-01-04 ENCOUNTER — Ambulatory Visit: Payer: BC Managed Care – PPO | Attending: Internal Medicine

## 2020-01-04 DIAGNOSIS — Z23 Encounter for immunization: Secondary | ICD-10-CM

## 2020-01-04 NOTE — Progress Notes (Signed)
   Covid-19 Vaccination Clinic  Name:  Carrie Stevenson    MRN: [id number] DOB: 1950-12-18  01/04/2020  [name] was observed post Covid-19 immunization for 15 minutes without incident. She was provided with Vaccine Information Sheet and instruction to access the V-Safe system.   [name] was instructed to call 911 with any severe reactions post vaccine: Marland Kitchen Difficulty breathing  . Swelling of face and throat  . A fast heartbeat  . A bad rash all over body  . Dizziness and weakness   Immunizations Administered    Name Date Dose VIS Date Route   Pfizer COVID-19 Vaccine 01/04/2020  9:43 AM 0.3 mL 10/01/2019 Intramuscular   Manufacturer: [employer], [employer]   Lot: [id number]   NDC: [address]-9

## 2020-02-09 ENCOUNTER — Other Ambulatory Visit: Payer: Self-pay | Admitting: Internal Medicine

## 2020-02-09 DIAGNOSIS — I10 Essential (primary) hypertension: Secondary | ICD-10-CM

## 2020-02-26 ENCOUNTER — Other Ambulatory Visit: Payer: Self-pay | Admitting: Internal Medicine

## 2020-02-26 DIAGNOSIS — I10 Essential (primary) hypertension: Secondary | ICD-10-CM

## 2020-02-28 NOTE — Telephone Encounter (Signed)
Patient needs follow up appointment with me. Thank you.

## 2020-03-25 ENCOUNTER — Other Ambulatory Visit: Payer: Self-pay | Admitting: Internal Medicine

## 2020-03-25 DIAGNOSIS — I10 Essential (primary) hypertension: Secondary | ICD-10-CM

## 2020-03-31 ENCOUNTER — Other Ambulatory Visit: Payer: Self-pay

## 2020-03-31 DIAGNOSIS — I10 Essential (primary) hypertension: Secondary | ICD-10-CM

## 2020-04-03 ENCOUNTER — Ambulatory Visit: Payer: BC Managed Care – PPO | Admitting: Internal Medicine

## 2020-04-03 ENCOUNTER — Other Ambulatory Visit: Payer: Self-pay

## 2020-04-03 ENCOUNTER — Encounter: Payer: Self-pay | Admitting: Internal Medicine

## 2020-04-03 VITALS — BP 150/84 | HR 70 | Temp 98.2°F | Ht 63.5 in | Wt 180.6 lb

## 2020-04-03 DIAGNOSIS — E119 Type 2 diabetes mellitus without complications: Secondary | ICD-10-CM

## 2020-04-03 DIAGNOSIS — I2510 Atherosclerotic heart disease of native coronary artery without angina pectoris: Secondary | ICD-10-CM

## 2020-04-03 DIAGNOSIS — I10 Essential (primary) hypertension: Secondary | ICD-10-CM

## 2020-04-03 DIAGNOSIS — D509 Iron deficiency anemia, unspecified: Secondary | ICD-10-CM | POA: Diagnosis not present

## 2020-04-03 LAB — GLUCOSE, CAPILLARY: Glucose-Capillary: 132 mg/dL — ABNORMAL HIGH (ref 70–99)

## 2020-04-03 LAB — POCT GLYCOSYLATED HEMOGLOBIN (HGB A1C): Hemoglobin A1C: 5.7 % — AB (ref 4.0–5.6)

## 2020-04-03 MED ORDER — LISINOPRIL 40 MG PO TABS: 40.0000 mg | ORAL_TABLET | Freq: Every day | ORAL | 3 refills | Status: DC

## 2020-04-03 MED ORDER — HYDROCHLOROTHIAZIDE 12.5 MG PO TABS: 12.5000 mg | ORAL_TABLET | Freq: Every day | ORAL | 5 refills | Status: DC

## 2020-04-03 MED ORDER — ROSUVASTATIN CALCIUM 40 MG PO TABS: 40.0000 mg | ORAL_TABLET | Freq: Every day | ORAL | 3 refills | Status: DC

## 2020-04-03 NOTE — Patient Instructions (Addendum)
Ms. Carrie Stevenson,  It was a pleasure to see you today. Thank you for coming in.   Today we discussed your blood pressure. This was a little high today, start taking HCTZ 12.5 mg daily. Please start checking your blood pressures daily. Please return in 1 month for [place] to recheck your blood pressure.   We also discussed your diabetes. Please continue taking metformin daily. Your A1c looks good today!  Please follow up with the gastroenterologist in regards to your low blood counts. They will do a procedure to look inside your stomach to see if they can find where the blood is coming from. We are checking some labs today and will contact you if they are abnormal.   Please return to clinic in 1 month or sooner if needed.   Thank you again for coming in.   Claudean Severance.D.

## 2020-04-03 NOTE — Progress Notes (Signed)
   CC: Hypertension, diabetes, and iron deficiency anemia  HPI:  [name] is a 69 y.o. with a history of CAD status post stent placement, diabetes, iron deficiency anemia hyperlipidemia, and hypertension presenting for follow-up of her hypertension, diabetes, and iron deficiency anemia.  Past Medical History:  Diagnosis Date  . CAD (coronary artery disease), native coronary artery 04/25/2017   Hx of stent placement in [place].  . Diabetes mellitus without complication ([facility])   . Hypercholesteremia   . Hypertension    Review of Systems:   Constitutional: Negative for chills and fever.  Respiratory: Negative for shortness of breath.   Cardiovascular: Negative for chest pain and leg swelling.  Gastrointestinal: Negative for abdominal pain, nausea and vomiting.  Neurological: Negative for dizziness and headaches.   Physical Exam:  Vitals:   04/03/20 1338 04/03/20 1424  BP: (!) 150/89 (!) 150/84  Pulse: 74 70  Temp: 98.2 F (36.8 C)   TempSrc: Oral   SpO2: 100%   Weight: 180 lb 9.6 oz (81.9 kg)   Height: 5' 3.5" (1.613 m)    Physical Exam Constitutional:      Appearance: Normal appearance.  HENT:     Head: Normocephalic and atraumatic.     Mouth/Throat:     Mouth: Mucous membranes are moist.     Pharynx: Oropharynx is clear.  Cardiovascular:     Rate and Rhythm: Normal rate and regular rhythm.     Pulses: Normal pulses.     Heart sounds: Normal heart sounds.  Pulmonary:     Effort: Pulmonary effort is normal.     Breath sounds: Normal breath sounds.  Abdominal:     General: Abdomen is flat. Bowel sounds are normal.     Palpations: Abdomen is soft.  Musculoskeletal:        General: No swelling. Normal range of motion.     Cervical back: Normal range of motion and neck supple.  Skin:    General: Skin is warm and dry.     Capillary Refill: Capillary refill takes less than 2 seconds.  Neurological:     General: No focal deficit present.     Mental Status: She is  alert and oriented to person, place, and time.  Psychiatric:        Mood and Affect: Mood normal.        Behavior: Behavior normal.     Assessment & Plan:   See Encounters Tab for problem based charting.  Patient discussed with Dr. Mikey Bussing

## 2020-04-04 LAB — CBC
Hematocrit: 39.6 % (ref 34.0–46.6)
Hemoglobin: 12.8 g/dL (ref 11.1–15.9)
MCH: 25.7 pg — ABNORMAL LOW (ref 26.6–33.0)
MCHC: 32.3 g/dL (ref 31.5–35.7)
MCV: 79 fL (ref 79–97)
Platelets: 289 10*3/uL (ref 150–450)
RBC: 4.99 x10E6/uL (ref 3.77–5.28)
RDW: 15.4 % (ref 11.7–15.4)
WBC: 7.1 10*3/uL (ref 3.4–10.8)

## 2020-04-04 NOTE — Progress Notes (Signed)
Internal Medicine Clinic Attending  Case discussed with Dr. Krienke at the time of the visit.  We reviewed the resident's history and exam and pertinent patient test results.  I agree with the assessment, diagnosis, and plan of care documented in the resident's note.    

## 2020-04-04 NOTE — Assessment & Plan Note (Signed)
Patient is currently taking metformin 1000 mg daily.  Denies any issues taking the medication.  A1c on last visit was 5.3, today A1c is 5.7.  Foot exam performed today.  No changes at this time.

## 2020-04-04 NOTE — Assessment & Plan Note (Signed)
Patient has a history of iron deficiency anemia, she had a hemoglobin done last year and this was down to 7, from 13 a few years prior, there is no significant evidence of bleeding on exam when she has seen gastroenterology.  Gastroenterology did a colonoscopy in Sept 2020 that showed no acute findings, evidence of hemorrhoids on exam.  She had an EGD scheduled however appears to have missed that appointment.   Today she appears to be doing well, she denies any symptoms of anemia, denies any lightheadedness, dizziness, fatigue, weakness, headaches, chest pain, shortness of breath, nausea, vomiting, abdominal pain, or any other symptoms.  On exam she is actually hypertensive, no tachycardia noted, no conjunctival pallor and normal capillary refill normal.  Still unclear of the cause of her iron deficiency anemia.  Advised patient to follow-up with GI to obtain the EGD.  -Repeat CBC today -Continue iron tablets -Follow-up with GI

## 2020-04-04 NOTE — Assessment & Plan Note (Signed)
Patient is currently on lisinopril 40 mg daily, Coreg 25 mg twice daily and amlodipine 10 mg daily.  She reports taking her medications as prescribed, denies any issues taking her medications.  She occasionally will check her blood pressure at home and return 120/80.  Blood pressure today is elevated at 150/80, repeat was 150/84.  Blood pressure was elevated on her last visit, however at that time she had not been taking her medications.  Still concerning for an uncontrolled blood pressure we will add HCTZ to her regimen today.  -Continue lisinopril 40 mg daily -Continue Coreg 25 mg twice daily -Continue amlodipine 10 mg daily -Add HCTZ 12.5 mg daily -RTC in 1 month, advised to monitor blood pressures at home and to bring in meter on next visit to compare to our meter

## 2020-04-23 ENCOUNTER — Other Ambulatory Visit: Payer: Self-pay | Admitting: Internal Medicine

## 2020-04-23 DIAGNOSIS — I10 Essential (primary) hypertension: Secondary | ICD-10-CM

## 2020-05-02 ENCOUNTER — Other Ambulatory Visit: Payer: Self-pay | Admitting: Internal Medicine

## 2020-05-02 DIAGNOSIS — I10 Essential (primary) hypertension: Secondary | ICD-10-CM

## 2020-05-12 ENCOUNTER — Other Ambulatory Visit: Payer: Self-pay | Admitting: Internal Medicine

## 2020-05-22 ENCOUNTER — Other Ambulatory Visit: Payer: Self-pay | Admitting: Internal Medicine

## 2020-05-22 DIAGNOSIS — I10 Essential (primary) hypertension: Secondary | ICD-10-CM

## 2020-05-24 NOTE — Progress Notes (Signed)
   CC: HTN, DM, and left ankle pain  HPI:  [name] is a 69 y.o. with the history listed below presenting for HTN, DM, and left ankle pain.   Past Medical History:  Diagnosis Date  . CAD (coronary artery disease), native coronary artery 04/25/2017   Hx of stent placement in [place].  . Diabetes mellitus without complication ([facility])   . Hypercholesteremia   . Hypertension    Review of Systems:   Constitutional: Negative for chills and fever.  Respiratory: Negative for shortness of breath.   Cardiovascular: Negative for chest pain and leg swelling.  Gastrointestinal: Negative for abdominal pain, nausea and vomiting.  Neurological: Negative for dizziness and headaches.   Physical Exam:  Vitals:   05/26/20 1319  Weight: 180 lb 12.8 oz (82 kg)  Height: 5\' 3"  (1.6 m)   Physical Exam Constitutional:      Appearance: Normal appearance.  HENT:     Head: Normocephalic and atraumatic.     Nose: Nose normal.  Cardiovascular:     Rate and Rhythm: Normal rate.     Pulses: Normal pulses.     Heart sounds: Normal heart sounds.  Pulmonary:     Effort: Pulmonary effort is normal.     Breath sounds: Normal breath sounds.  Abdominal:     General: Abdomen is flat. Bowel sounds are normal.     Palpations: Abdomen is soft.  Musculoskeletal:        General: Swelling and tenderness present.     Comments: TTP over lateral left ankle, no erythema noted  Skin:    General: Skin is warm and dry.     Capillary Refill: Capillary refill takes less than 2 seconds.  Neurological:     General: No focal deficit present.     Mental Status: She is alert and oriented to person, place, and time.  Psychiatric:        Mood and Affect: Mood normal.        Behavior: Behavior normal.     Assessment & Plan:   See Encounters Tab for problem based charting.  Patient discussed with Dr. 

## 2020-05-26 ENCOUNTER — Other Ambulatory Visit: Payer: Self-pay

## 2020-05-26 ENCOUNTER — Encounter: Payer: Self-pay | Admitting: Internal Medicine

## 2020-05-26 ENCOUNTER — Ambulatory Visit (INDEPENDENT_AMBULATORY_CARE_PROVIDER_SITE_OTHER): Payer: BC Managed Care – PPO | Admitting: Internal Medicine

## 2020-05-26 VITALS — BP 133/80 | HR 66 | Temp 98.2°F | Ht 63.0 in | Wt 180.8 lb

## 2020-05-26 DIAGNOSIS — I2510 Atherosclerotic heart disease of native coronary artery without angina pectoris: Secondary | ICD-10-CM

## 2020-05-26 DIAGNOSIS — M25572 Pain in left ankle and joints of left foot: Secondary | ICD-10-CM | POA: Insufficient documentation

## 2020-05-26 DIAGNOSIS — E119 Type 2 diabetes mellitus without complications: Secondary | ICD-10-CM

## 2020-05-26 DIAGNOSIS — I10 Essential (primary) hypertension: Secondary | ICD-10-CM

## 2020-05-26 DIAGNOSIS — D509 Iron deficiency anemia, unspecified: Secondary | ICD-10-CM

## 2020-05-26 NOTE — Assessment & Plan Note (Signed)
Patient reports that she has been having this left ankle pain and left leg pain for about 1 week, [place] started in her left lower thigh and radiated down to her calf and is now located in the left ankle.  She reports that it comes and goes.  Worse with walking.  She has been using over-the-counter Voltaren gel which she reports helps.  On exam she does have some swelling around the lateral Achilles tendon mild tenderness to palpation, no warmth or erythema noted.  Overall appears to be mild strain.  Given information on rice and advised to contact us if symptoms not improved within the next few weeks.

## 2020-05-26 NOTE — Assessment & Plan Note (Signed)
Patient is currently on lisinopril 40 mg daily, Coreg 25 mg twice daily, amlodipine 10 mg daily and HCTZ 12.5 mg daily.  Blood pressure today is improved at 139/84, repeat was 133/80.  She denies any issues taking her medications however does report that she is feeling a little bit more thirsty and having more urination after starting HCTZ.  She denies any nausea, vomiting, lightheadedness, fatigue, chest pain, shortness of breath, or other symptoms.  -Continue amlodipine 10 mg daily -Continue Coreg 25 mg twice daily -Continue lisinopril 40 mg daily -Continue HCTZ 12.5 mg daily -Repeat BMP today

## 2020-05-26 NOTE — Assessment & Plan Note (Signed)
Patient is currently on Metformin 1000 mg daily.  A1c on 04/03/2020 was 5.7.  She reports no issues taking medications.  She reports that she pick up a new meter.  Last A1c was well controlled at 5.7.  Will check at the 65-month mark.

## 2020-05-26 NOTE — Assessment & Plan Note (Signed)
Patient has a history of CAD Promus 4 mmx 28 mm stent placed in the RCA in 2010.  She does not have any chest pain, palpitations, shortness of breath, or other symptoms.  She is currently on aspirin daily.  No changes at this time.

## 2020-05-26 NOTE — Assessment & Plan Note (Signed)
Patient has a history of iron deficiency anemia, she is currently on iron supplementation.  Her hemoglobin on 04/03/2020 was 12.8.  She will continue to follow-up with GI to obtain an EGD.  No further work-up at this time.

## 2020-05-26 NOTE — Patient Instructions (Addendum)
Ms. Carrie Stevenson,  It was a pleasure to see you today. Thank you for coming in.   Today we discussed your blood pressure. In regards to this please continue taking your current medications. I am checking some labs today and will contact you if they are abnormal.   We also discussed diabetes. Please continue taking the metformin. We will repeat the A1c in a few months.   In regards to your ankle pain, this looks like a small sprain. Please try to rest the area, avoid going on long walks or strenuous exercise for now. You can ice the area, use some compression, and try to elevate the leg. If this worsens or does not improve over the next few weeks please return to clinic.   Please return to clinic in 3 months or sooner if needed.   Thank you again for coming in.   Hermine Messick M.D.   RICE Therapy for Routine Care of Injuries Many injuries can be cared for with rest, ice, compression, and elevation (RICE therapy). This includes:  Resting the injured part.  Putting ice on the injury.  Putting pressure (compression) on the injury.  Raising the injured part (elevation). Using RICE therapy can help to lessen pain and swelling. Supplies needed:  Ice.  Plastic bag.  Towel.  Elastic bandage.  Pillow or pillows to raise (elevate) your injured body part. How to care for your injury with RICE therapy Rest Limit your normal activities, and try not to use the injured part of your body. You can go back to your normal activities when your doctor says it is okay to do them and you feel okay. Ask your doctor if you should do exercises to help your injury get better. Ice Put ice on the injured area. Do not put ice on your bare skin.  Put ice in a plastic bag.  Place a towel between your skin and the bag.  Leave the ice on for 20 minutes, 2-3 times a day. Use ice on as many days as told by your doctor.  Compression Compression means putting pressure on the injured area. This can  be done with an elastic bandage. If an elastic bandage has been put on your injury:  Do not wrap the bandage too tight. Wrap the bandage more loosely if part of your body away from the bandage is blue, swollen, cold, painful, or loses feeling (gets numb).  Take off the bandage and put it on again. Do this every 3-4 hours or as told by your doctor.  See your doctor if the bandage seems to make your problems worse.  Elevation Elevation means keeping the injured area raised. If you can, raise the injured area above your heart or the center of your chest. Contact a doctor if:  You keep having pain and swelling.  Your symptoms get worse. Get help right away if:  You have sudden bad pain at your injury or lower than your injury.  You have redness or more swelling around your injury.  You have tingling or numbness at your injury or lower than your injury, and it does not go away when you take off the bandage. Summary  Many injuries can be cared for using rest, ice, compression, and elevation (RICE therapy).  You can go back to your normal activities when you feel okay and your doctor says it is okay.  Put ice on the injured area as told by your doctor.  Get help if your symptoms get worse  or if you keep having pain and swelling. This information is not intended to replace advice given to you by your health care provider. Make sure you discuss any questions you have with your health care provider. Document Revised: 06/27/2017 Document Reviewed: 06/27/2017 Elsevier Patient Education  2020 [employer].

## 2020-05-27 LAB — BMP8+ANION GAP
Anion Gap: 15 mmol/L (ref 10.0–18.0)
BUN/Creatinine Ratio: 11 — ABNORMAL LOW (ref 12–28)
BUN: 8 mg/dL (ref 8–27)
CO2: 21 mmol/L (ref 20–29)
Calcium: 9.2 mg/dL (ref 8.7–10.3)
Chloride: 105 mmol/L (ref 96–106)
Creatinine, Ser: 0.74 mg/dL (ref 0.57–1.00)
GFR calc Af Amer: 98 mL/min/{1.73_m2} (ref >59)
GFR calc non Af Amer: 85 mL/min/{1.73_m2} (ref >59)
Glucose: 82 mg/dL (ref 65–99)
Potassium: 4.4 mmol/L (ref 3.5–5.2)
Sodium: 141 mmol/L (ref 134–144)

## 2020-05-30 NOTE — Progress Notes (Signed)
Internal Medicine Clinic Attending ° °Case discussed with Dr. Krienke  At the time of the visit.  We reviewed the resident’s history and exam and pertinent patient test results.  I agree with the assessment, diagnosis, and plan of care documented in the resident’s note.  °

## 2020-06-05 ENCOUNTER — Other Ambulatory Visit: Payer: Self-pay | Admitting: Internal Medicine

## 2020-06-05 DIAGNOSIS — I10 Essential (primary) hypertension: Secondary | ICD-10-CM

## 2020-09-24 ENCOUNTER — Other Ambulatory Visit: Payer: Self-pay | Admitting: Internal Medicine

## 2020-09-24 DIAGNOSIS — I10 Essential (primary) hypertension: Secondary | ICD-10-CM

## 2021-01-08 ENCOUNTER — Other Ambulatory Visit: Payer: Self-pay | Admitting: Internal Medicine

## 2021-01-08 DIAGNOSIS — Z1231 Encounter for screening mammogram for malignant neoplasm of breast: Secondary | ICD-10-CM

## 2021-02-19 ENCOUNTER — Ambulatory Visit (HOSPITAL_COMMUNITY)
Admission: EM | Admit: 2021-02-19 | Discharge: 2021-02-19 | Disposition: A | Discharge status: Home / Self Care | Payer: Medicare Other | Attending: Physician Assistant | Admitting: Physician Assistant

## 2021-02-19 ENCOUNTER — Encounter (HOSPITAL_COMMUNITY): Payer: Self-pay

## 2021-02-19 ENCOUNTER — Other Ambulatory Visit: Payer: Self-pay

## 2021-02-19 DIAGNOSIS — M545 Low back pain, unspecified: Secondary | ICD-10-CM

## 2021-02-19 MED ORDER — TIZANIDINE HCL 4 MG PO CAPS: 4.0000 mg | ORAL_CAPSULE | Freq: Two times a day (BID) | ORAL | 0 refills | Status: DC | PRN

## 2021-02-19 NOTE — ED Triage Notes (Signed)
Pt presents with lower back pain, left leg pain x 2 days. States pain started after picking up a case of water.

## 2021-02-19 NOTE — Discharge Instructions (Addendum)
Take Zanaflex up to twice a day as needed for pain.  You should not drive or drink alcohol with this medication as drowsiness is a common side effect.  You can use Tylenol for additional pain relief.  I recommend heat and stretch for additional symptoms.  You may need imaging or physical therapy referral if your symptoms persist please follow-up with your primary care provider.  If anything suddenly worsens please return for reevaluation.

## 2021-02-19 NOTE — ED Provider Notes (Signed)
[facility]    CSN: [id number] Arrival date & time: 02/19/21  6767      History   Chief Complaint Chief Complaint  Patient presents with  . Back Pain    HPI Carrie Stevenson is a 70 y.o. female.   Patient presents today with a several day history of left lumbar back pain following injury.  Reports she was picking up a heavy case of water when she felt a sudden pain in her lower back and has had persistent pain since that time.  Pain is rated 8 on a 0-10 pain scale, localized to left lumbar back with radiation across lumbar back, described as sharp, worse with certain movements, no alleviating factors identified.  She has tried topical Voltaren without improvement of symptoms.  She denies previous injury, spinal surgery, history of malignancy.  Denies any bowel/bladder incontinence, lower extremity weakness, saddle anesthesia.  Reports symptoms are interfering with ability perform daily activities.     Past Medical History:  Diagnosis Date  . CAD (coronary artery disease), native coronary artery 04/25/2017   Hx of stent placement in [place].  . Diabetes mellitus without complication (HCC)   . Hypercholesteremia   . Hypertension     Patient Active Problem List   Diagnosis Date Noted  . Left ankle pain 05/26/2020  . Screening for osteoporosis 09/01/2019  . Iron deficiency anemia 08/26/2019  . Fatigue 08/23/2019  . Restless leg syndrome 08/23/2019  . Obstructive sleep apnea 12/01/2017  . CAD (coronary artery disease), native coronary artery 04/25/2017  . Health care maintenance 04/25/2017  . Diabetes mellitus without complication (HCC) 12/10/2016  . Hypertension 12/10/2016    Past Surgical History:  Procedure Laterality Date  . ABDOMINAL HYSTERECTOMY    . CORONARY ANGIOPLASTY WITH STENT PLACEMENT      OB History   No obstetric history on file.      Home Medications    Prior to Admission medications   Medication Sig Start Date End Date Taking? Authorizing  Provider  tiZANidine (ZANAFLEX) 4 MG capsule Take 1 capsule (4 mg total) by mouth 2 (two) times daily as needed for muscle spasms. 02/19/21  Yes Elle Vezina K, PA-C  amLODipine (NORVASC) 10 MG tablet TAKE 1 TABLET BY MOUTH EVERY DAY 05/02/20   Dellia Cloud, MD  carvedilol (COREG) 25 MG tablet TAKE 1 TABLET BY MOUTH TWICE A DAY 06/05/20   Claudean Severance, MD  clopidogrel (PLAVIX) 75 MG tablet TAKE 1 TABLET BY MOUTH EVERY DAY 08/11/19   Claudean Severance, MD  diclofenac sodium (VOLTAREN) 1 % GEL Apply 2 g topically 4 (four) times daily. 10/22/18   Petrucelli, Samantha R, PA-C  ferrous sulfate 325 (65 FE) MG tablet TAKE 1 TABLET BY MOUTH EVERY DAY 05/12/20   Dellia Cloud, MD  folic acid (FOLVITE) 1 MG tablet TAKE 1 TABLET BY MOUTH EVERY DAY 06/05/20   Claudean Severance, MD  hydrochlorothiazide (HYDRODIURIL) 12.5 MG tablet TAKE 1 TABLET BY MOUTH EVERY DAY 09/25/20   Roylene Reason, MD  isosorbide dinitrate (ISORDIL) 30 MG tablet TAKE 1 TABLET BY MOUTH EVERY DAY 07/14/19   Claudean Severance, MD  lisinopril (ZESTRIL) 40 MG tablet Take 1 tablet (40 mg total) by mouth daily. 04/03/20   Claudean Severance, MD  metFORMIN (GLUCOPHAGE) 1000 MG tablet TAKE 1 TABLET (1,000 MG TOTAL) BY MOUTH 2 (TWO) TIMES DAILY WITH A MEAL. 10/30/19   Claudean Severance, MD  rosuvastatin (CRESTOR) 40 MG tablet Take 1 tablet (40 mg total)  by mouth daily. 04/03/20   Claudean Severance, MD    Family History Family History  Problem Relation Age of Onset  . Lupus Mother   . Lupus Sister   . Cancer Sister   . Lupus Brother   . Cerebral aneurysm Son     Social History Social History   Tobacco Use  . Smoking status: Former Smoker    Quit date: 04/26/2007    Years since quitting: 13.8  . Smokeless tobacco: Never Used  Vaping Use  . Vaping Use: Never used  Substance Use Topics  . Alcohol use: Yes    Alcohol/week: 0.0 standard drinks    Comment: 1 glass of wine per month  . Drug use: No     Allergies   Patient has no  known allergies.   Review of Systems Review of Systems  Constitutional: Positive for activity change. Negative for appetite change, fatigue and fever.  Respiratory: Negative for cough and shortness of breath.   Cardiovascular: Negative for chest pain.  Gastrointestinal: Negative for abdominal pain, diarrhea, nausea and vomiting.  Musculoskeletal: Positive for back pain. Negative for arthralgias and myalgias.  Neurological: Negative for dizziness, weakness, light-headedness and headaches.     Physical Exam Triage Vital Signs ED Triage Vitals  Enc Vitals Group     BP 02/19/21 0933 (!) 144/85     Pulse Rate 02/19/21 0933 79     Resp 02/19/21 0933 18     Temp 02/19/21 0933 97.8 F (36.6 C)     Temp src --      SpO2 02/19/21 0933 97 %     Weight --      Height --      Head Circumference --      Peak Flow --      Pain Score 02/19/21 0930 8     Pain Loc --      Pain Edu? --      Excl. in GC? --    No data found.  Updated Vital Signs BP (!) 144/85 (BP Location: Right Arm)   Pulse 79   Temp 97.8 F (36.6 C)   Resp 18   LMP  (LMP Unknown)   SpO2 97%   Visual Acuity Right Eye Distance:   Left Eye Distance:   Bilateral Distance:    Right Eye Near:   Left Eye Near:    Bilateral Near:     Physical Exam Vitals reviewed.  Constitutional:      General: She is awake. She is not in acute distress.    Appearance: Normal appearance. She is not ill-appearing.     Comments: Very pleasant female appears stated age in no acute distress  HENT:     Head: Normocephalic and atraumatic.  Cardiovascular:     Rate and Rhythm: Normal rate and regular rhythm.     Heart sounds: No murmur heard.   Pulmonary:     Effort: Pulmonary effort is normal.     Breath sounds: Normal breath sounds. No wheezing, rhonchi or rales.     Comments: Clear to auscultation bilaterally Abdominal:     Palpations: Abdomen is soft.     Tenderness: There is no abdominal tenderness.  Musculoskeletal:      Cervical back: No tenderness or bony tenderness.     Thoracic back: No tenderness or bony tenderness.     Lumbar back: Tenderness present. No bony tenderness. Negative right straight leg raise test and negative left straight leg raise test.  Comments: Back: Pain with extension; normal active range of motion with forward flexion, lateral flexion, rotation.  No pain percussion of vertebrae.  Mild tenderness palpation of left lumbar paraspinal muscles.  Psychiatric:        Behavior: Behavior is cooperative.      UC Treatments / Results  Labs (all labs ordered are listed, but only abnormal results are displayed) Labs Reviewed - No data to display  EKG   Radiology No results found.  Procedures Procedures (including critical care time)  Medications Ordered in UC Medications - No data to display  Initial Impression / Assessment and Plan / UC Course  I have reviewed the triage vital signs and the nursing notes.  Pertinent labs & imaging results that were available during my care of the patient were reviewed by me and considered in my medical decision making (see chart for details).     No indication for imaging given no bony tenderness on exam.  Suspect muscle strain and so patient was started on Zanaflex with instruction not to drive or drink alcohol this medication as drowsiness is a common side effect.  She is unable to take NSAIDs due to concurrent use of Plavix so will use Tylenol to help manage symptoms.  Recommended heat and stretch for symptom relief.  Discussed potential utility of imaging and/or physical therapy referral if symptoms persist and recommended she follow-up with PCP.  Strict return precautions given to which patient expressed understanding.  Final Clinical Impressions(s) / UC Diagnoses   Final diagnoses:  Acute left-sided low back pain without sciatica     Discharge Instructions     Take Zanaflex up to twice a day as needed for pain.  You should not  drive or drink alcohol with this medication as drowsiness is a common side effect.  You can use Tylenol for additional pain relief.  I recommend heat and stretch for additional symptoms.  You may need imaging or physical therapy referral if your symptoms persist please follow-up with your primary care provider.  If anything suddenly worsens please return for reevaluation.    ED Prescriptions    Medication Sig Dispense Auth. Provider   tiZANidine (ZANAFLEX) 4 MG capsule Take 1 capsule (4 mg total) by mouth 2 (two) times daily as needed for muscle spasms. 20 capsule Kaytee Taliercio, Noberto Retort, PA-C     PDMP not reviewed this encounter.   Jeani Hawking, PA-C 02/19/21 1057

## 2021-02-26 ENCOUNTER — Other Ambulatory Visit (HOSPITAL_COMMUNITY): Payer: Self-pay

## 2021-02-26 ENCOUNTER — Ambulatory Visit (INDEPENDENT_AMBULATORY_CARE_PROVIDER_SITE_OTHER): Payer: Medicare Other | Admitting: Internal Medicine

## 2021-02-26 ENCOUNTER — Encounter: Payer: Self-pay | Admitting: Internal Medicine

## 2021-02-26 ENCOUNTER — Other Ambulatory Visit: Payer: Self-pay

## 2021-02-26 ENCOUNTER — Telehealth: Payer: Self-pay | Admitting: Internal Medicine

## 2021-02-26 VITALS — BP 136/90 | HR 73 | Temp 98.5°F | Ht 63.0 in | Wt 180.4 lb

## 2021-02-26 DIAGNOSIS — E7849 Other hyperlipidemia: Secondary | ICD-10-CM

## 2021-02-26 DIAGNOSIS — M5440 Lumbago with sciatica, unspecified side: Secondary | ICD-10-CM | POA: Insufficient documentation

## 2021-02-26 DIAGNOSIS — M549 Dorsalgia, unspecified: Secondary | ICD-10-CM | POA: Insufficient documentation

## 2021-02-26 DIAGNOSIS — I10 Essential (primary) hypertension: Secondary | ICD-10-CM

## 2021-02-26 DIAGNOSIS — I2510 Atherosclerotic heart disease of native coronary artery without angina pectoris: Secondary | ICD-10-CM | POA: Diagnosis not present

## 2021-02-26 DIAGNOSIS — E119 Type 2 diabetes mellitus without complications: Secondary | ICD-10-CM | POA: Diagnosis present

## 2021-02-26 DIAGNOSIS — M545 Low back pain, unspecified: Secondary | ICD-10-CM

## 2021-02-26 DIAGNOSIS — D509 Iron deficiency anemia, unspecified: Secondary | ICD-10-CM | POA: Diagnosis not present

## 2021-02-26 DIAGNOSIS — E785 Hyperlipidemia, unspecified: Secondary | ICD-10-CM | POA: Insufficient documentation

## 2021-02-26 LAB — GLUCOSE, CAPILLARY: Glucose-Capillary: 92 mg/dL (ref 70–99)

## 2021-02-26 LAB — POCT GLYCOSYLATED HEMOGLOBIN (HGB A1C): Hemoglobin A1C: 5.9 % — AB (ref 4.0–5.6)

## 2021-02-26 MED ORDER — CARVEDILOL 25 MG PO TABS
25.0000 mg | ORAL_TABLET | Freq: Two times a day (BID) | ORAL | 5 refills | Status: AC
  Filled 2021-02-26: qty 60, 30d supply, fill #0
  Filled 2021-04-30: qty 60, 30d supply, fill #1
  Filled 2021-08-28: qty 60, 30d supply, fill #2

## 2021-02-26 MED ORDER — ISOSORBIDE DINITRATE 30 MG PO TABS
30.0000 mg | ORAL_TABLET | Freq: Every day | ORAL | 0 refills | Status: DC
  Filled 2021-02-26: qty 30, 30d supply, fill #0
  Filled 2021-04-30: qty 30, 30d supply, fill #1
  Filled 2021-07-24: qty 30, 30d supply, fill #2

## 2021-02-26 MED ORDER — HYDROCHLOROTHIAZIDE 12.5 MG PO TABS
12.5000 mg | ORAL_TABLET | Freq: Every day | ORAL | 5 refills | Status: AC
  Filled 2021-02-26: qty 30, 30d supply, fill #0
  Filled 2021-04-30: qty 30, 30d supply, fill #1

## 2021-02-26 MED ORDER — LISINOPRIL 40 MG PO TABS: 40.0000 mg | ORAL_TABLET | Freq: Every day | ORAL | 3 refills | Status: DC

## 2021-02-26 MED ORDER — FERROUS SULFATE 325 (65 FE) MG PO TABS
325.0000 mg | ORAL_TABLET | Freq: Every day | ORAL | 5 refills | Status: AC
  Filled 2021-02-26: qty 30, 30d supply, fill #0

## 2021-02-26 MED ORDER — ROSUVASTATIN CALCIUM 40 MG PO TABS: 40.0000 mg | ORAL_TABLET | Freq: Every day | ORAL | 3 refills | Status: DC

## 2021-02-26 MED ORDER — AMLODIPINE BESYLATE 10 MG PO TABS
1.0000 | ORAL_TABLET | Freq: Every day | ORAL | 5 refills | Status: DC
  Filled 2021-02-26: qty 30, 30d supply, fill #0
  Filled 2021-04-30: qty 30, 30d supply, fill #1

## 2021-02-26 MED ORDER — CLOPIDOGREL BISULFATE 75 MG PO TABS: 1.0000 | ORAL_TABLET | Freq: Every day | ORAL | 0 refills | Status: DC

## 2021-02-26 MED ORDER — ISOSORBIDE DINITRATE 30 MG PO TABS: 30.0000 mg | ORAL_TABLET | Freq: Every day | ORAL | 0 refills | Status: DC

## 2021-02-26 MED ORDER — CARVEDILOL 25 MG PO TABS: 25.0000 mg | ORAL_TABLET | Freq: Two times a day (BID) | ORAL | 3 refills | Status: DC

## 2021-02-26 MED ORDER — FERROUS SULFATE 325 (65 FE) MG PO TABS: 325.0000 mg | ORAL_TABLET | Freq: Every day | ORAL | 1 refills | Status: DC

## 2021-02-26 MED ORDER — LISINOPRIL 40 MG PO TABS
40.0000 mg | ORAL_TABLET | Freq: Every day | ORAL | 5 refills | Status: DC
  Filled 2021-02-26: qty 30, 30d supply, fill #0
  Filled 2021-04-30: qty 30, 30d supply, fill #1

## 2021-02-26 MED ORDER — AMLODIPINE BESYLATE 10 MG PO TABS: 1.0000 | ORAL_TABLET | Freq: Every day | ORAL | 3 refills | Status: DC

## 2021-02-26 MED ORDER — ROSUVASTATIN CALCIUM 40 MG PO TABS
40.0000 mg | ORAL_TABLET | Freq: Every day | ORAL | 5 refills | Status: AC
  Filled 2021-02-26: qty 30, 30d supply, fill #0
  Filled 2021-04-30: qty 30, 30d supply, fill #1
  Filled 2021-11-23: qty 30, 30d supply, fill #2

## 2021-02-26 MED ORDER — CLOPIDOGREL BISULFATE 75 MG PO TABS
1.0000 | ORAL_TABLET | Freq: Every day | ORAL | 0 refills | Status: DC
  Filled 2021-02-26: qty 30, 30d supply, fill #0

## 2021-02-26 MED ORDER — HYDROCHLOROTHIAZIDE 12.5 MG PO TABS: 12.5000 mg | ORAL_TABLET | Freq: Every day | ORAL | 3 refills | Status: DC

## 2021-02-26 NOTE — Assessment & Plan Note (Addendum)
Patient has a history of CAD status post stenting and is currently supposed to be on Plavix daily.  Is unclear if she is taking this medication since she may not have had refills.  She denies any chest pain, shortness of breath, palpitations, abdominal pain, or any other symptoms.  She has not seen her cardiologist in over a year. -Refill Plavix 90 day prescription -Refill, isosorbide dinitrate 90-day prescription -Advised patient to follow-up with cardiology  Addendum: After chart review and review with attending patient had stent placed in 2010s and should no longer require plavix. Contacted patient and advised to not take this. Removed medications from chart.

## 2021-02-26 NOTE — Assessment & Plan Note (Signed)
Patient is currently prescribed lisinopril 40 mg daily, Coreg 25 mg twice daily, amlodipine 10 mg daily, and HCTZ 12.5 mg daily.  She states that she has not been taking the lisinopril because she does not think she had this.  She denies any headaches, vision changes, chest pain, shortness of breath, nausea, vomiting, lightheadedness, fatigue, or other symptoms.  Blood pressure today was 151/98, repeat was 136/90.  Slightly above goal.  Discussed that if she is not taking her lisinopril then we should resume this medication. -Refilled lisinopril, Coreg, amlodipine, and HCTZ -Check BMP today

## 2021-02-26 NOTE — Progress Notes (Signed)
   CC: Back pain, HTN, HLD, DM, IDA  HPI:  [name] is a 70 y.o. with history of CAD status post stent, diabetes, hyperlipidemia, iron deficiency anemia, and hypertension presenting for follow-up of recent ED visit for back pain and her chronic medical conditions.  Past Medical History:  Diagnosis Date  . CAD (coronary artery disease), native coronary artery 04/25/2017   Hx of stent placement in [place].  . Diabetes mellitus without complication (HCC)   . Hypercholesteremia   . Hypertension    Review of Systems:   Constitutional: Negative for chills and fever.  Respiratory: Negative for shortness of breath.   Cardiovascular: Negative for chest pain and leg swelling.  Gastrointestinal: Negative for abdominal pain, nausea and vomiting.  Musculoskeletal: Negative for back pain.  Neurological: Negative for dizziness, weakness, numbness, and headaches.   Physical Exam:  Vitals:   02/26/21 1019 02/26/21 1027  BP: (!) 151/98 136/90  Pulse: 74 73  Temp: 98.5 F (36.9 C)   TempSrc: Oral   SpO2: 100%   Weight: 180 lb 6.4 oz (81.8 kg)   Height: 5\' 3"  (1.6 m)    Physical Exam HENT:     Head: Normocephalic and atraumatic.  Eyes:     Conjunctiva/sclera: Conjunctivae normal.     Pupils: Pupils are equal, round, and reactive to light.  Neck:     Thyroid: No thyromegaly.  Cardiovascular:     Rate and Rhythm: Normal rate and regular rhythm.     Heart sounds: Normal heart sounds. No murmur heard. No friction rub. No gallop.   Pulmonary:     Effort: Pulmonary effort is normal. No respiratory distress.     Breath sounds: Normal breath sounds. No wheezing.  Abdominal:     General: Bowel sounds are normal. There is no distension.     Palpations: Abdomen is soft.  Musculoskeletal:        General: No swelling or tenderness. Normal range of motion.     Cervical back: Normal range of motion and neck supple.     Right lower leg: No edema.     Left lower leg: No edema.  Skin:     General: Skin is warm and dry.     Capillary Refill: Capillary refill takes less than 2 seconds.     Findings: No erythema.  Neurological:     Mental Status: She is alert and oriented to person, place, and time.     Gait: Gait is intact.  Psychiatric:        Mood and Affect: Mood and affect normal.     Assessment & Plan:   See Encounters Tab for problem based charting.  Patient discussed with Dr. 

## 2021-02-26 NOTE — Patient Instructions (Addendum)
Ms. Carrie Stevenson,  It was a pleasure to see you today. Thank you for coming in.   Today we discussed your diabetes. Your A1c today is 5.9, this is great. We can try taking you off the metformin for now since you may not need it. We will repeat your A1c on your next visit to see if we need to restart this.   We also discussed your back pain. I am glad that you are feeling better. You can continue taking the Tizanidine for now. I have given you some information on back stretches that you can do.    We also discussed your blood pressure. I have sent in refills for your medications. If you are not taking the lisinopril please restart taking this medication.   Here is the information for the Cardiologist:  [facility] [address] [place], [place], [place]  [address] Phone: [phone]; Fax: [phone]   Please return to clinic in 3 months or sooner if needed.   Thank you again for coming in.   Claudean Severance.D.

## 2021-02-26 NOTE — Addendum Note (Signed)
Addended by: Claudean Severance on: 02/26/2021 05:35 PM   Modules accepted: Orders

## 2021-02-26 NOTE — Assessment & Plan Note (Signed)
Patient was seen in the ED on 5/2 for left lower back pain that had some radiation down the back of her leg, she was moving some ice around the day prior to this and when she woke up she noticed the symptoms.  She had been trying Voltaren gel which did not help.  She did not have any red flag symptoms and she was given a course of tizanidine.  She initially said that the pain radiated to her right side after starting the tizanidine however then the symptoms improved.  She is not having any symptoms at this time.  On exam she has no tenderness to palpation, normal range of motion, and no neurological deficits.  No further work-up.

## 2021-02-26 NOTE — Telephone Encounter (Signed)
Pt seen this morning 02/26/2021 and is concerned about getting her prescribed medications.  Please call patient back.

## 2021-02-26 NOTE — Assessment & Plan Note (Signed)
Patient has a history of iron deficiency anemia and is currently on iron supplementation.  She had previously seen GI and was scheduled to have an EGD of her she did not show up to this.  Advised patient of the importance of obtaining an EGD and advised to contact GI for follow-up. -Repeat CBC today -Continue iron supplementation

## 2021-02-26 NOTE — Telephone Encounter (Signed)
Pt is new to medicare- Meds sent into pharmacy today-but pharmacy states pt does not have a medicare d plan.  Per pt she can not sign up for a medicare D (prescription drug) plan until Oct (missed deadline) Pt now worried about obtaining meds  Will send info to prescribing MD and pharmacy staff for suggestions/advice   Is [facility] or [facility] a temporary option until pt can obtain prescription coverage? Please advise.Kingsley Spittle Cassady5/9/20223:01 PM

## 2021-02-26 NOTE — Assessment & Plan Note (Signed)
Patient has a history of diabetes, her last A1c was 5.7, today her A1c is 5.9.  She is currently taking metformin 1000 mg once daily.  She does report occasional loose stools every few weeks, and some mild abdominal discomfort.  She denies any polyuria, polydipsia, dysphagia, abdominal pain, or numbness and tingling in her hands or feet.  She has had not had an eye exam recently.  Discussed that her A1c is well controlled today, we can consider discontinuing metformin for now and see if she needs this.  She was in agreement with this plan.    -Advised patient to follow-up with ophthalmology -Foot exam performed today -Discontinue metformin -RTC in 3 months for repeat A1c

## 2021-02-27 ENCOUNTER — Other Ambulatory Visit (HOSPITAL_COMMUNITY): Payer: Self-pay

## 2021-02-27 LAB — BMP8+ANION GAP
Anion Gap: 15 mmol/L (ref 10.0–18.0)
BUN/Creatinine Ratio: 10 — ABNORMAL LOW (ref 12–28)
BUN: 9 mg/dL (ref 8–27)
CO2: 22 mmol/L (ref 20–29)
Calcium: 9.6 mg/dL (ref 8.7–10.3)
Chloride: 104 mmol/L (ref 96–106)
Creatinine, Ser: 0.9 mg/dL (ref 0.57–1.00)
Glucose: 82 mg/dL (ref 65–99)
Potassium: 4.2 mmol/L (ref 3.5–5.2)
Sodium: 141 mmol/L (ref 134–144)
eGFR: 70 mL/min/{1.73_m2} (ref >59)

## 2021-02-27 LAB — CBC
Hematocrit: 39 % (ref 34.0–46.6)
Hemoglobin: 12.2 g/dL (ref 11.1–15.9)
MCH: 24.9 pg — ABNORMAL LOW (ref 26.6–33.0)
MCHC: 31.3 g/dL — ABNORMAL LOW (ref 31.5–35.7)
MCV: 80 fL (ref 79–97)
Platelets: 382 10*3/uL (ref 150–450)
RBC: 4.9 x10E6/uL (ref 3.77–5.28)
RDW: 14.3 % (ref 11.7–15.4)
WBC: 8 10*3/uL (ref 3.4–10.8)

## 2021-02-27 NOTE — Telephone Encounter (Signed)
Pt aware of "temporary" solution until she can get her Medicare D plan in Oct.Carrie Revelle Cassady5/10/20229:52 AM

## 2021-02-28 ENCOUNTER — Other Ambulatory Visit (HOSPITAL_COMMUNITY): Payer: Self-pay

## 2021-02-28 NOTE — Progress Notes (Signed)
Internal Medicine Clinic Attending  Case discussed with Dr. Gwyneth Revels  At the time of the visit.  We reviewed the resident's history and exam and pertinent patient test results.  I agree with the assessment, diagnosis, and plan of care documented in the resident's note.  Typically clopidogrel is not prescribed beyond a year after coronary stent placement though she may have another indication which I may have missed.  She has seen Dr. Eldridge Dace, cardiologist, within past few years upon establishing local care. [place]

## 2021-02-28 NOTE — Addendum Note (Signed)
Addended by: Claudean Severance on: 02/28/2021 05:26 PM   Modules accepted: Orders

## 2021-03-02 ENCOUNTER — Other Ambulatory Visit: Payer: Self-pay

## 2021-03-02 ENCOUNTER — Ambulatory Visit
Admission: RE | Admit: 2021-03-02 | Discharge: 2021-03-02 | Disposition: A | Discharge status: Home / Self Care | Payer: Medicare Other | Source: Ambulatory Visit | Attending: Internal Medicine | Admitting: Internal Medicine

## 2021-03-02 DIAGNOSIS — Z1231 Encounter for screening mammogram for malignant neoplasm of breast: Secondary | ICD-10-CM

## 2021-03-05 ENCOUNTER — Telehealth: Payer: Self-pay | Admitting: Internal Medicine

## 2021-03-05 NOTE — Telephone Encounter (Signed)
Pt requesting a call back.  Patient stated she is no better and that her back is really hurting.

## 2021-03-05 NOTE — Telephone Encounter (Signed)
Return pt's call- states she continues to have back pain, now with urinary frequency; denies having an odor and pain w/urination . She's concern she may have an UTI. Inform pt she will need an appt - call transfer to front office. Appt schedule 5/18 with Dr Gwyneth Revels.

## 2021-03-06 NOTE — Progress Notes (Signed)
   CC: Back pain  HPI:  [name] is a 70 y.o. with the history listed below presenting for back pain and HLD.   Past Medical History:  Diagnosis Date  . CAD (coronary artery disease), native coronary artery 04/25/2017   Hx of stent placement in [place].  . Diabetes mellitus without complication (HCC)   . Hypercholesteremia   . Hypertension    Review of Systems:   Constitutional: Negative for chills and fever.  Respiratory: Negative for shortness of breath.   Cardiovascular: Negative for chest pain and leg swelling.  Gastrointestinal: Negative for abdominal pain, nausea and vomiting.  Neurological: Negative for dizziness and headaches.    Physical Exam:  Vitals:   03/07/21 0916  BP: 108/63  Pulse: 74  Temp: 97.7 F (36.5 C)  TempSrc: Oral  SpO2: 100%  Weight: 181 lb (82.1 kg)  Height: 5\' 3"  (1.6 m)   Physical Exam Constitutional:      Appearance: Normal appearance.  HENT:     Head: Normocephalic and atraumatic.     Mouth/Throat:     Mouth: Mucous membranes are moist.     Pharynx: Oropharynx is clear.  Cardiovascular:     Rate and Rhythm: Normal rate and regular rhythm.     Pulses: Normal pulses.     Heart sounds: Normal heart sounds.  Pulmonary:     Effort: Pulmonary effort is normal.     Breath sounds: Normal breath sounds.  Abdominal:     General: Abdomen is flat. Bowel sounds are normal.  Musculoskeletal:        General: Tenderness (No TTP, right lower back pain with active movement of right leg) present.  Skin:    General: Skin is warm and dry.  Neurological:     Mental Status: She is alert.      Assessment & Plan:   See Encounters Tab for problem based charting.  Patient discussed with Dr. 

## 2021-03-07 ENCOUNTER — Ambulatory Visit (INDEPENDENT_AMBULATORY_CARE_PROVIDER_SITE_OTHER): Payer: Medicare Other | Admitting: Internal Medicine

## 2021-03-07 ENCOUNTER — Encounter: Payer: Self-pay | Admitting: Internal Medicine

## 2021-03-07 VITALS — BP 108/63 | HR 74 | Temp 97.7°F | Ht 63.0 in | Wt 181.0 lb

## 2021-03-07 DIAGNOSIS — M545 Low back pain, unspecified: Secondary | ICD-10-CM

## 2021-03-07 DIAGNOSIS — E7849 Other hyperlipidemia: Secondary | ICD-10-CM | POA: Diagnosis not present

## 2021-03-07 DIAGNOSIS — I2510 Atherosclerotic heart disease of native coronary artery without angina pectoris: Secondary | ICD-10-CM

## 2021-03-07 MED ORDER — MELOXICAM 7.5 MG PO TABS: 7.5000 mg | ORAL_TABLET | Freq: Every day | ORAL | 0 refills | Status: DC

## 2021-03-07 NOTE — Assessment & Plan Note (Signed)
Patient presenting with right sided lower back pain for the past 2 to 3 weeks, radiates down to her right thigh, it is a cramping sensation, worse with laying down or being inactive.  Improves when she is walking or when she is being active.  It is currently a 5 out of 10.  She had similar pain on her left side a few weeks ago, she had presented to the ED and was given a course of tizanidine, she states that the tizanidine helped for a little bit however then stopped helping, she no longer has this medication.  She denies any fevers, chills, nausea, vomiting, abdominal pain, chest pain, dysuria, urinary or bowel incontinence, personal history of cancer, weight changes.  Denies any significant trauma, she does state it started after she was carrying some heavy bags of ice. On exam she has no tenderness to palpation, no limited range of motion, some tenderness with active flexion of right hip. Patient has no red flag symptoms, no tenderness on exam.  She reports that this does not limit her ability to do things she needs to or wants to do in life.  This could be consistent with osteoarthritis versus lumbar strain versus non-specific lower back pain, discussed that there is no indication at this time for additional imaging at this time.   -Short course of NSAIDs  -Provided information on back exercises and stretches that she can try

## 2021-03-07 NOTE — Assessment & Plan Note (Signed)
Currently on rosuvastatin 40 mg daily. -Repeat lipid panel today

## 2021-03-07 NOTE — Patient Instructions (Addendum)
Ms. Carrie Stevenson,  It was a pleasure to see you today. Thank you for coming in.   Today we discussed your back pain. Your exam today was reassuring. This seems like a muscle strain. Please start using Mobic daily for the next week. I have provided you some information about some back stretches that you can do.   Please return to clinic in 3 months or sooner if needed.   Thank you again for coming in.   Claudean Severance.D.\

## 2021-03-08 LAB — LIPID PANEL
Chol/HDL Ratio: 2.4 ratio (ref 0.0–4.4)
Cholesterol, Total: 100 mg/dL (ref 100–199)
HDL: 41 mg/dL (ref >39)
LDL Chol Calc (NIH): 48 mg/dL (ref 0–99)
Triglycerides: 39 mg/dL (ref 0–149)
VLDL Cholesterol Cal: 11 mg/dL (ref 5–40)

## 2021-03-08 NOTE — Progress Notes (Signed)
Internal Medicine Clinic Attending ° °Case discussed with Dr. Krienke  At the time of the visit.  We reviewed the resident’s history and exam and pertinent patient test results.  I agree with the assessment, diagnosis, and plan of care documented in the resident’s note.  °

## 2021-03-17 ENCOUNTER — Encounter: Payer: Self-pay | Admitting: *Deleted

## 2021-04-24 ENCOUNTER — Encounter: Payer: Self-pay | Admitting: *Deleted

## 2021-04-30 ENCOUNTER — Other Ambulatory Visit (HOSPITAL_COMMUNITY): Payer: Self-pay

## 2021-05-11 ENCOUNTER — Emergency Department (HOSPITAL_COMMUNITY)
Admission: EM | Admit: 2021-05-11 | Discharge: 2021-05-11 | Disposition: A | Discharge status: Home / Self Care | Payer: Medicare Other | Attending: Emergency Medicine | Admitting: Emergency Medicine

## 2021-05-11 ENCOUNTER — Other Ambulatory Visit: Payer: Self-pay

## 2021-05-11 ENCOUNTER — Emergency Department (HOSPITAL_COMMUNITY): Payer: Medicare Other

## 2021-05-11 ENCOUNTER — Encounter: Payer: Self-pay | Admitting: Interventional Cardiology

## 2021-05-11 ENCOUNTER — Other Ambulatory Visit (HOSPITAL_COMMUNITY): Payer: Self-pay

## 2021-05-11 ENCOUNTER — Ambulatory Visit (INDEPENDENT_AMBULATORY_CARE_PROVIDER_SITE_OTHER): Payer: Medicare Other | Admitting: Interventional Cardiology

## 2021-05-11 ENCOUNTER — Encounter (HOSPITAL_COMMUNITY): Payer: Self-pay | Admitting: Emergency Medicine

## 2021-05-11 VITALS — BP 110/72 | HR 69 | Ht 63.0 in | Wt 180.0 lb

## 2021-05-11 DIAGNOSIS — S3992XA Unspecified injury of lower back, initial encounter: Secondary | ICD-10-CM | POA: Diagnosis present

## 2021-05-11 DIAGNOSIS — I10 Essential (primary) hypertension: Secondary | ICD-10-CM

## 2021-05-11 DIAGNOSIS — Z79899 Other long term (current) drug therapy: Secondary | ICD-10-CM | POA: Insufficient documentation

## 2021-05-11 DIAGNOSIS — I2510 Atherosclerotic heart disease of native coronary artery without angina pectoris: Secondary | ICD-10-CM

## 2021-05-11 DIAGNOSIS — S39012A Strain of muscle, fascia and tendon of lower back, initial encounter: Secondary | ICD-10-CM | POA: Diagnosis not present

## 2021-05-11 DIAGNOSIS — Y92410 Unspecified street and highway as the place of occurrence of the external cause: Secondary | ICD-10-CM | POA: Insufficient documentation

## 2021-05-11 DIAGNOSIS — Z955 Presence of coronary angioplasty implant and graft: Secondary | ICD-10-CM | POA: Diagnosis not present

## 2021-05-11 DIAGNOSIS — I25118 Atherosclerotic heart disease of native coronary artery with other forms of angina pectoris: Secondary | ICD-10-CM

## 2021-05-11 DIAGNOSIS — E782 Mixed hyperlipidemia: Secondary | ICD-10-CM | POA: Diagnosis not present

## 2021-05-11 DIAGNOSIS — I252 Old myocardial infarction: Secondary | ICD-10-CM

## 2021-05-11 DIAGNOSIS — Z87891 Personal history of nicotine dependence: Secondary | ICD-10-CM | POA: Diagnosis not present

## 2021-05-11 DIAGNOSIS — R519 Headache, unspecified: Secondary | ICD-10-CM | POA: Insufficient documentation

## 2021-05-11 DIAGNOSIS — M7918 Myalgia, other site: Secondary | ICD-10-CM

## 2021-05-11 DIAGNOSIS — E119 Type 2 diabetes mellitus without complications: Secondary | ICD-10-CM | POA: Diagnosis not present

## 2021-05-11 MED ORDER — DICLOFENAC SODIUM 1 % EX GEL: 2.0000 g | Freq: Four times a day (QID) | CUTANEOUS | 0 refills | Status: AC

## 2021-05-11 MED ORDER — LIDOCAINE 5 % EX PTCH: 1.0000 | MEDICATED_PATCH | CUTANEOUS | 0 refills | Status: DC

## 2021-05-11 MED ORDER — NITROGLYCERIN 0.4 MG SL SUBL
0.4000 mg | SUBLINGUAL_TABLET | SUBLINGUAL | 6 refills | Status: AC | PRN
  Filled 2021-05-11: qty 25, 5d supply, fill #0

## 2021-05-11 MED ORDER — LIDOCAINE 5 % EX PTCH
1.0000 | MEDICATED_PATCH | CUTANEOUS | Status: DC
  Administered 2021-05-11: 1 via TRANSDERMAL
  Filled 2021-05-11: qty 1

## 2021-05-11 MED ORDER — CLOPIDOGREL BISULFATE 75 MG PO TABS
75.0000 mg | ORAL_TABLET | Freq: Every day | ORAL | 3 refills | Status: AC
  Filled 2021-05-11: qty 90, 90d supply, fill #0

## 2021-05-11 MED ORDER — ACETAMINOPHEN 325 MG PO TABS
650.0000 mg | ORAL_TABLET | Freq: Once | ORAL | Status: AC
  Administered 2021-05-11: 650 mg via ORAL
  Filled 2021-05-11: qty 2

## 2021-05-11 NOTE — Progress Notes (Signed)
Cardiology Office Note   Date:  05/11/2021   ID:  Carrie Stevenson April 19, 1951, MRN [id number]  PCP:  Claudean Severance, MD    No chief complaint on file.  CAD  Wt Readings from Last 3 Encounters:  05/11/21 180 lb (81.6 kg)  03/07/21 181 lb (82.1 kg)  02/26/21 180 lb 6.4 oz (81.8 kg)       History of Present Illness: Carrie Stevenson is a 70 y.o. female who is being seen today for the evaluation of CAD at the request of Claudean Severance, MD.   [place] PCI in 2018 [place].   In 2018, she was having chest pain and nausea.  She was having an MI and she had emergency cath and PCI.  She was in the hospital or one day.  Since then, no cardiac issues.   Denies : Chest pain. Dizziness. Leg edema. Nitroglycerin use. Orthopnea. Palpitations. Paroxysmal nocturnal dyspnea. Shortness of breath. Syncope.    She retired and now has not been exercising as much.    Past Medical History:  Diagnosis Date   CAD (coronary artery disease), native coronary artery 04/25/2017   Hx of stent placement in [place].   Diabetes mellitus without complication (HCC)    Hypercholesteremia    Hypertension     Past Surgical History:  Procedure Laterality Date   ABDOMINAL HYSTERECTOMY     CORONARY ANGIOPLASTY WITH STENT PLACEMENT       Current Outpatient Medications  Medication Sig Dispense Refill   amLODipine (NORVASC) 10 MG tablet Take 1 tablet (10 mg total) by mouth daily. 30 tablet 5   aspirin EC 81 MG tablet Take 81 mg by mouth daily. Swallow whole.     carvedilol (COREG) 25 MG tablet Take 1 tablet (25 mg total) by mouth 2 (two) times daily. 60 tablet 5   ferrous sulfate 325 (65 FE) MG tablet Take 1 tablet (325 mg total) by mouth daily. 30 tablet 5   folic acid (FOLVITE) 1 MG tablet TAKE 1 TABLET BY MOUTH EVERY DAY 90 tablet 1   hydrochlorothiazide (HYDRODIURIL) 12.5 MG tablet Take 1 tablet (12.5 mg total) by mouth daily. 30 tablet 5   isosorbide dinitrate  (ISORDIL) 30 MG tablet Take 1 tablet (30 mg total) by mouth daily. 90 tablet 0   lisinopril (ZESTRIL) 40 MG tablet Take 1 tablet (40 mg total) by mouth daily. 30 tablet 5   meloxicam (MOBIC) 7.5 MG tablet Take 1 tablet (7.5 mg total) by mouth daily. 14 tablet 0   rosuvastatin (CRESTOR) 40 MG tablet Take 1 tablet (40 mg total) by mouth daily. 30 tablet 5   tiZANidine (ZANAFLEX) 4 MG capsule Take 1 capsule (4 mg total) by mouth 2 (two) times daily as needed for muscle spasms. 20 capsule 0   No current facility-administered medications for this visit.    Allergies:   Patient has no known allergies.    Social History:  The patient  reports that she quit smoking about 14 years ago. Her smoking use included cigarettes. She has never used smokeless tobacco. She reports current alcohol use. She reports that she does not use drugs.   Family History:  The patient's family history includes Cancer in her sister; Cerebral aneurysm in her son; Lupus in her brother, mother, and sister.    ROS:  Please see the history of present illness.   Otherwise, review of systems are positive for low back pain.   All other  systems are reviewed and negative.    PHYSICAL EXAM: VS:  BP 110/72   Pulse 69   Ht 5\' 3"  (1.6 m)   Wt 180 lb (81.6 kg)   LMP  (LMP Unknown)   SpO2 98%   BMI 31.89 kg/m  , BMI Body mass index is 31.89 kg/m. GEN: Well nourished, well developed, in no acute distress HEENT: normal Neck: no JVD, carotid bruits, or masses Cardiac: RRR; no murmurs, rubs, or gallops,no edema  Respiratory:  clear to auscultation bilaterally, normal work of breathing GI: soft, nontender, nondistended, + BS MS: no deformity or atrophy; 2+ DP pulses Skin: warm and dry, no rash Neuro:  Strength and sensation are intact Psych: euthymic mood, full affect   EKG:   The ekg ordered today demonstrates NSR inferior Q waves   Recent Labs: 02/26/2021: BUN 9; Creatinine, Ser 0.90; Hemoglobin 12.2; Platelets 382;  Potassium 4.2; Sodium 141   Lipid Panel    Component Value Date/Time   CHOL 100 03/07/2021 1020   TRIG 39 03/07/2021 1020   HDL 41 03/07/2021 1020   CHOLHDL 2.4 03/07/2021 1020   LDLCALC 48 03/07/2021 1020     Other studies Reviewed: Additional studies/ records that were reviewed today with results demonstrating: LDL 48.   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  She felt better on plavix compared to aspirin monotherapy in the last month.  Will go back to Plavix 75 mg daily, monotherapy, as this is better secondary prevention than aspirin.  Stop aspirin.  No bleeding issues.  No angina on medical therapy.  She will see if she has the stent card with exact details of the stent. Refill SL NTG.  N CHF sx.  HTN: The current medical regimen is effective;  continue present plan and medications. Hyperlipidemia: COntinue high dose rosuvastatin. The current medical regimen is effective;  continue present plan and medications. PreDM: A1C 5.9.  Whole food, plant based diet.  No longer on metformin.  Family h/o CAD: sisters with CABG.    Current medicines are reviewed at length with the patient today.  The patient concerns regarding her medicines were addressed.  The following changes have been made:  No change  Labs/ tests ordered today include:  No orders of the defined types were placed in this encounter.   Recommend 150 minutes/week of aerobic exercise Low fat, low carb, high fiber diet recommended  Disposition:   FU in 6 months   Signed, 03/09/2021, MD  05/11/2021 9:53 AM    [facility] [address] [place], [place], [place]  [place] Phone: [phone]; Fax: [phone]

## 2021-05-11 NOTE — Discharge Instructions (Addendum)
Apply topical Voltaren to low back as needed as prescribed.  You can also use Lidoderm patches. Recheck with your PCP, you may benefit from PT if pain is not improving after a few days. Return to the emergency room for severe concerning symptoms.
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
## 2021-05-11 NOTE — Patient Instructions (Signed)
Medication Instructions:  Your physician has recommended you make the following change in your medication: Stop aspirin. Start Clopidogrel 75 mg by mouth daily  *If you need a refill on your cardiac medications before your next appointment, please call your pharmacy*   Lab Work: none If you have labs (blood work) drawn today and your tests are completely normal, you will receive your results only by: MyChart Message (if you have MyChart) OR A paper copy in the mail If you have any lab test that is abnormal or we need to change your treatment, we will call you to review the results.   Testing/Procedures: none   Follow-Up: At [facility], you and your health needs are our priority.  As part of our continuing mission to provide you with exceptional heart care, we have created designated Provider Care Teams.  These Care Teams include your primary Cardiologist (physician) and Advanced Practice Providers (APPs -  Physician Assistants and Nurse Practitioners) who all work together to provide you with the care you need, when you need it.  We recommend signing up for the patient portal called "MyChart".  Sign up information is provided on this After Visit Summary.  MyChart is used to connect with patients for Virtual Visits (Telemedicine).  Patients are able to view lab/test results, encounter notes, upcoming appointments, etc.  Non-urgent messages can be sent to your provider as well.   To learn more about what you can do with MyChart, go to [email or web address].    Your next appointment:   January 25,2023 at 10:00  The format for your next appointment:   In Person  Provider:   Dr Eldridge Dace   Other Instructions Please bring stent card to next appointment
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
## 2021-05-22 ENCOUNTER — Other Ambulatory Visit (HOSPITAL_COMMUNITY): Payer: Self-pay

## 2021-05-22 ENCOUNTER — Other Ambulatory Visit: Payer: Self-pay

## 2021-05-22 ENCOUNTER — Encounter: Payer: Self-pay | Admitting: Internal Medicine

## 2021-05-22 ENCOUNTER — Ambulatory Visit (INDEPENDENT_AMBULATORY_CARE_PROVIDER_SITE_OTHER): Payer: Medicare Other | Admitting: Internal Medicine

## 2021-05-22 VITALS — BP 121/77 | HR 69 | Temp 98.2°F | Ht 63.0 in | Wt 181.4 lb

## 2021-05-22 DIAGNOSIS — I2510 Atherosclerotic heart disease of native coronary artery without angina pectoris: Secondary | ICD-10-CM

## 2021-05-22 DIAGNOSIS — M5440 Lumbago with sciatica, unspecified side: Secondary | ICD-10-CM | POA: Diagnosis not present

## 2021-05-22 DIAGNOSIS — M5442 Lumbago with sciatica, left side: Secondary | ICD-10-CM

## 2021-05-22 MED ORDER — IBUPROFEN 800 MG PO TABS
800.0000 mg | ORAL_TABLET | Freq: Three times a day (TID) | ORAL | 0 refills | Status: DC | PRN
  Filled 2021-05-22: qty 30, 10d supply, fill #0

## 2021-05-22 MED ORDER — BACLOFEN 10 MG PO TABS
5.0000 mg | ORAL_TABLET | Freq: Three times a day (TID) | ORAL | 0 refills | Status: AC | PRN
  Filled 2021-05-22 (×2): qty 21, 14d supply, fill #0

## 2021-05-22 MED ORDER — BACLOFEN 10 MG PO TABS
5.0000 mg | ORAL_TABLET | Freq: Three times a day (TID) | ORAL | 0 refills | Status: DC | PRN
Start: 2021-05-22 — End: 2021-05-22
  Filled 2021-05-22: qty 21, 14d supply, fill #0

## 2021-05-22 MED ORDER — IBUPROFEN 800 MG PO TABS
800.0000 mg | ORAL_TABLET | Freq: Three times a day (TID) | ORAL | 0 refills | Status: AC | PRN
  Filled 2021-05-22 (×2): qty 30, 10d supply, fill #0

## 2021-05-22 NOTE — Progress Notes (Signed)
CC: Back pain  HPI:  [name] is a 70 y.o. female with a past medical history stated below and presents today for back pain. Please see problem based assessment and plan for additional details.  Past Medical History:  Diagnosis Date   CAD (coronary artery disease), native coronary artery 04/25/2017   Hx of stent placement in [place].   Diabetes mellitus without complication (HCC)    Hypercholesteremia    Hypertension     Current Outpatient Medications on File Prior to Visit  Medication Sig Dispense Refill   amLODipine (NORVASC) 10 MG tablet Take 1 tablet (10 mg total) by mouth daily. 30 tablet 5   carvedilol (COREG) 25 MG tablet Take 1 tablet (25 mg total) by mouth 2 (two) times daily. 60 tablet 5   clopidogrel (PLAVIX) 75 MG tablet Take 1 tablet (75 mg total) by mouth daily. 90 tablet 3   diclofenac Sodium (VOLTAREN) 1 % GEL Apply 2 g topically 4 (four) times daily. 100 g 0   ferrous sulfate 325 (65 FE) MG tablet Take 1 tablet (325 mg total) by mouth daily. 30 tablet 5   folic acid (FOLVITE) 1 MG tablet TAKE 1 TABLET BY MOUTH EVERY DAY 90 tablet 1   hydrochlorothiazide (HYDRODIURIL) 12.5 MG tablet Take 1 tablet (12.5 mg total) by mouth daily. 30 tablet 5   isosorbide dinitrate (ISORDIL) 30 MG tablet Take 1 tablet (30 mg total) by mouth daily. 90 tablet 0   lidocaine (LIDODERM) 5 % Place 1 patch onto the skin daily. Remove & Discard patch within 12 hours or as directed by MD 30 patch 0   lisinopril (ZESTRIL) 40 MG tablet Take 1 tablet (40 mg total) by mouth daily. 30 tablet 5   meloxicam (MOBIC) 7.5 MG tablet Take 1 tablet (7.5 mg total) by mouth daily. 14 tablet 0   nitroGLYCERIN (NITROSTAT) 0.4 MG SL tablet Place 1 tablet (0.4 mg total) under the tongue every 5 (five) minutes as needed for chest pain. 25 tablet 6   rosuvastatin (CRESTOR) 40 MG tablet Take 1 tablet (40 mg total) by mouth daily. 30 tablet 5   No current facility-administered medications on file prior to visit.     Family History  Problem Relation Age of Onset   Lupus Mother    Lupus Sister    Cancer Sister    Lupus Brother    Cerebral aneurysm Son     Social History   Socioeconomic History   Marital status: Single    Spouse name: Not on file   Number of children: Not on file   Years of education: Not on file   Highest education level: Not on file  Occupational History   Not on file  Tobacco Use   Smoking status: Former    Types: Cigarettes    Quit date: 04/26/2007    Years since quitting: 14.0   Smokeless tobacco: Never  Vaping Use   Vaping Use: Never used  Substance and Sexual Activity   Alcohol use: Yes    Alcohol/week: 0.0 standard drinks    Comment: 1 glass of wine per month   Drug use: No   Sexual activity: Not on file  Other Topics Concern   Not on file  Social History Narrative   Not on file   Social Determinants of Health   Financial Resource Strain: Not on file  Food Insecurity: Not on file  Transportation Needs: Not on file  Physical Activity: Not on file  Stress: Not  on file  Social Connections: Not on file  Intimate Partner Violence: Not on file    Review of Systems: ROS negative except for what is noted on the assessment and plan.  Vitals:   05/22/21 0832  BP: 121/77  Pulse: 69  Temp: 98.2 F (36.8 C)  TempSrc: Oral  SpO2: 100%  Weight: 181 lb 6.4 oz (82.3 kg)  Height: 5\' 3"  (1.6 m)    Physical Exam: Gen: A&O x3 and in no apparent distress, well appearing and nourished. HEENT: Head - normocephalic, atraumatic. Eye -  visual acuity grossly intact, conjunctiva clear, sclera non-icteric, EOM intact. Mouth - No obvious caries or periodontal disease. Neck: no obvious masses or nodules, AROM intact. CV: RRR, no murmurs, rubs, or gallops. S1/S2 presents  Resp: Clear to ascultation bilaterally  Abd: BS (+) x4, soft, non-tender, without obvious hepatosplenomegaly or masses MSK: Grossly normal A/PROM and strength x4 extremities. Tenderness to  palpation of her left lumbar back, with positive straight leg test.  Skin: good skin turgor, no rashes, unusual bruising, or prominent lesions.  Neuro: No focal deficits, grossly normal sensation and coordination.  Psych: Oriented x3 and responding appropriately. Intact recent and remote memory, normal mood, judgement, affect , and insight.    Assessment & Plan:   See Encounters Tab for problem based charting.  Patient discussed with Dr. , D.O. [facility], PGY-2 Pager: [phone], Phone: [phone] Date 05/22/2021 Time 10:12 AM
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
## 2021-05-22 NOTE — Patient Instructions (Signed)
Thank you, Ms.Kerrie Pleasure for allowing [place] to provide your care today. Today we discussed back pain.    I have ordered the following labs for you:  Lab Orders  No laboratory test(s) ordered today     Tests ordered today:  None today but if your symptoms persist then we will need to get an MRI.  Referrals ordered today:   Referral Orders  No referral(s) requested today     Medication Changes:   Medications Discontinued During This Encounter  Medication Reason   tiZANidine (ZANAFLEX) 4 MG capsule Discontinued by provider     Meds ordered this encounter  Medications   baclofen (LIORESAL) 10 MG tablet    Sig: Take 0.5 tablets (5 mg total) by mouth 3 (three) times daily as needed for up to 14 days for muscle spasms.    Dispense:  21 tablet    Refill:  0   ibuprofen (ADVIL) 800 MG tablet    Sig: Take 1 tablet (800 mg total) by mouth every 8 (eight) hours as needed for up to 10 days for mild pain or moderate pain.    Dispense:  30 tablet    Refill:  0      Instructions:  - Please avoid adjustments at the Chiropractor in the short term - Please take these medication  with food and avoid operating motor vehicles or heavy machinery with the baclofen as we discussed.  Follow up:  1 month if pain persists    Remember: Should you have any questions or concerns please call the internal medicine clinic at [phone].     Dellia Cloud, D.O. [facility]

## 2021-05-31 NOTE — Progress Notes (Signed)
Internal Medicine Clinic Attending  Case discussed with Dr. Coe  At the time of the visit.  We reviewed the resident's history and exam and pertinent patient test results.  I agree with the assessment, diagnosis, and plan of care documented in the resident's note.  

## 2021-06-07 ENCOUNTER — Encounter: Payer: Medicare Other | Admitting: Internal Medicine

## 2021-06-26 ENCOUNTER — Other Ambulatory Visit (HOSPITAL_COMMUNITY): Payer: Self-pay

## 2021-06-26 ENCOUNTER — Ambulatory Visit (INDEPENDENT_AMBULATORY_CARE_PROVIDER_SITE_OTHER): Payer: Medicare Other | Admitting: Internal Medicine

## 2021-06-26 ENCOUNTER — Other Ambulatory Visit: Payer: Self-pay

## 2021-06-26 VITALS — BP 137/85 | HR 77 | Temp 98.1°F | Ht 63.0 in | Wt 175.3 lb

## 2021-06-26 DIAGNOSIS — M5442 Lumbago with sciatica, left side: Secondary | ICD-10-CM | POA: Diagnosis present

## 2021-06-26 DIAGNOSIS — I10 Essential (primary) hypertension: Secondary | ICD-10-CM | POA: Diagnosis not present

## 2021-06-26 DIAGNOSIS — M5440 Lumbago with sciatica, unspecified side: Secondary | ICD-10-CM

## 2021-06-26 DIAGNOSIS — G4733 Obstructive sleep apnea (adult) (pediatric): Secondary | ICD-10-CM | POA: Diagnosis not present

## 2021-06-26 DIAGNOSIS — R053 Chronic cough: Secondary | ICD-10-CM | POA: Insufficient documentation

## 2021-06-26 DIAGNOSIS — I2510 Atherosclerotic heart disease of native coronary artery without angina pectoris: Secondary | ICD-10-CM | POA: Diagnosis not present

## 2021-06-26 DIAGNOSIS — E7849 Other hyperlipidemia: Secondary | ICD-10-CM

## 2021-06-26 DIAGNOSIS — D509 Iron deficiency anemia, unspecified: Secondary | ICD-10-CM

## 2021-06-26 HISTORY — DX: Chronic cough: R05.3

## 2021-06-26 MED ORDER — TIZANIDINE HCL 4 MG PO TABS
4.0000 mg | ORAL_TABLET | Freq: Two times a day (BID) | ORAL | 0 refills | Status: DC | PRN
  Filled 2021-06-26: qty 20, 10d supply, fill #0

## 2021-06-26 MED ORDER — AMLODIPINE-OLMESARTAN 10-40 MG PO TABS
1.0000 | ORAL_TABLET | Freq: Every day | ORAL | 0 refills | Status: AC
  Filled 2021-06-26: qty 30, 30d supply, fill #0
  Filled 2021-08-28: qty 30, 30d supply, fill #1
  Filled 2021-11-23: qty 30, 30d supply, fill #2

## 2021-06-26 NOTE — Patient Instructions (Addendum)
Carrie Stevenson, it was a pleasure seeing you today!  Today we discussed: Low back pain: You are having low back that is unchanged from previous visit. You stated the muscle relaxer is not working. You stated tizanidine worked better for you in the past. We will change it to tizanidine. Also recommend IcyHot with Lidocaine. We will refer you to physical therapy to help with pain and increase mobility.  High Blood Pressure: You stated your blood pressure is well controlled. We will discontinue Lisinopril and Amlodipine and Start Azor 10-40 mg daily. Iron Deficiency Anemia: We will repeat some labs and schedule an endoscopy with GI in [place] to rule out any source of bleed.  Sleep Apnea: We will request records for your sleep study and refill supplies as needed after that.   I have ordered the following labs today:  Lab Orders         Iron, TIBC and Ferritin Panel        Referrals ordered today:   Referral Orders         Ambulatory referral to Physical Therapy         Ambulatory referral to Gastroenterology       I have ordered the following medication/changed the following medications:   Stop the following medications: Medications Discontinued During This Encounter  Medication Reason   amLODipine (NORVASC) 10 MG tablet    lisinopril (ZESTRIL) 40 MG tablet      Start the following medications: Meds ordered this encounter  Medications   amLODipine-olmesartan (AZOR) 10-40 MG tablet    Sig: Take 1 tablet by mouth daily.    Dispense:  90 tablet    Refill:  0    IM Program   tiZANidine (ZANAFLEX) 4 MG tablet    Sig: Take 1 tablet (4 mg total) by mouth 2 (two) times daily as needed for muscle spasms.    Dispense:  20 tablet    Refill:  0    IM PROGRAM     Follow-up: 3 months   Please make sure to arrive 15 minutes prior to your next appointment. If you arrive late, you may be asked to reschedule.   We look forward to seeing you next time. Please call our clinic at  [phone] if you have any questions or concerns. The best time to call is Monday-Friday from 9am-4pm, but there is someone available 24/7. If after hours or the weekend, call the main hospital number and ask for the Internal Medicine Resident On-Call. If you need medication refills, please notify your pharmacy one week in advance and they will send [place] a request.  Thank you for letting us take part in your care. Wishing you the best!  Thank you, Gwenevere Abbot, MD

## 2021-06-26 NOTE — Progress Notes (Signed)
   CC: Low Back Pain  HPI:  [name] is a 70 y.o. with medical history as below presenting to [facility] for follow up on low back pain.   Please see problem-based list for further details, assessments, and plans.  Past Medical History:  Diagnosis Date   CAD (coronary artery disease), native coronary artery 04/25/2017   Hx of stent placement in [place].   Diabetes mellitus without complication (HCC)    Hypercholesteremia    Hypertension    Review of Systems: Negative otherwise stated in the problem list.     Physical Exam:  Vitals:   06/26/21 0847  BP: 137/85  Pulse: 77  Temp: 98.1 F (36.7 C)  TempSrc: Oral  SpO2: 99%  Weight: 175 lb 4.8 oz (79.5 kg)  Height: 5\' 3"  (1.6 m)    Physical Exam Constitutional:      Appearance: Normal appearance.  HENT:     Head: Normocephalic and atraumatic.     Nose: Nose normal.     Mouth/Throat:     Mouth: Mucous membranes are moist.     Pharynx: Oropharynx is clear. No posterior oropharyngeal erythema.  Eyes:     Extraocular Movements: Extraocular movements intact.     Pupils: Pupils are equal, round, and reactive to light.  Cardiovascular:     Rate and Rhythm: Normal rate and regular rhythm.     Pulses: Normal pulses.     Heart sounds: Normal heart sounds.  Pulmonary:     Effort: Pulmonary effort is normal.     Breath sounds: Normal breath sounds.  Abdominal:     General: Bowel sounds are normal.     Palpations: Abdomen is soft.  Musculoskeletal:        General: Tenderness (Patient has tenderness on left lower back with radiation down the left leg) present. Normal range of motion.     Cervical back: Normal range of motion and neck supple.     Right lower leg: No edema.     Left lower leg: No edema.  Neurological:     General: No focal deficit present.     Mental Status: She is alert and oriented to person, place, and time.  Psychiatric:        Mood and Affect: Mood normal.        Behavior: Behavior normal.     Assessment & Plan:   See Encounters Tab for problem based charting.  Patient seen with Dr. , MD

## 2021-06-27 LAB — IRON,TIBC AND FERRITIN PANEL
Ferritin: 54 ng/mL (ref 15–150)
Iron Saturation: 30 % (ref 15–55)
Iron: 84 ug/dL (ref 27–139)
Total Iron Binding Capacity: 278 ug/dL (ref 250–450)
UIBC: 194 ug/dL (ref 118–369)

## 2021-06-27 NOTE — Assessment & Plan Note (Signed)
Assessment: Patient has history of iron deficiency anemia and was on iron supplementation. Advised patient to follow with GI for endoscopy as no source of bleeding was found during colonoscopy and she missed the scheduled endoscopy previously.  Plan: -Ordered Iron studies -Referral to GI in [facility] for endoscopy

## 2021-06-27 NOTE — Assessment & Plan Note (Signed)
Assessment: Patient has hyperlipidemia with last lipid panel in May 2022 showing Cholesterol 100, LDL 48, HDL 41. She is currently on Crestor 40 mg daily.   Plan: -Continue Crestor 40 mg

## 2021-06-27 NOTE — Assessment & Plan Note (Signed)
Assessment:  Patient has CAD with stent placement in 2018. She is on Plavix 75 mg.   Plan: -Continue Plavix 75 mg and Crestor 40 mg

## 2021-06-27 NOTE — Assessment & Plan Note (Signed)
Assessment: Patient reported persistent dry cough and was on ACE inhibitor for hypertension.   Plan: -Discontinued ACE inhibitor and started ARB in Az[place].

## 2021-06-27 NOTE — Assessment & Plan Note (Signed)
Assessment:  Patient is on CPAP at home and states she had a sleep study done in [place] 3 years ago. Requested a new sleep study so she can get CPAP supplies.  Plan: -Requested records from previous sleep study

## 2021-06-27 NOTE — Assessment & Plan Note (Signed)
Assessment: Patient continued to have low back pain with radiation down the left left. The pain in on the lower left side. On physical exam, the area was tender to palpation and patient stated when it was pressed on she had numbness in the left leg. Lower left back was tense on palpation. She stated Baclofen didn't work for her but Tizanidine has worked for her in the past.  Plan: -Tizanidine 4 mg BID prn for 20 doses -IcyHot with Lidocaine -PT referral

## 2021-06-27 NOTE — Assessment & Plan Note (Signed)
Assessment:  Patients blood pressure is well controlled. She has a persistent dry cough and is on an ACE inhibitor. We will discontinue Norvasc and Lisinopril and start Azor 10-40mg  daily.   Plan: -Discontinue Norvasc and Lisinopril and initiate Azor 10-40 mg daily.

## 2021-07-23 ENCOUNTER — Other Ambulatory Visit: Payer: Self-pay

## 2021-07-24 ENCOUNTER — Other Ambulatory Visit (HOSPITAL_COMMUNITY): Payer: Self-pay

## 2021-07-26 NOTE — Progress Notes (Signed)
Internal Medicine Clinic Attending  I saw and evaluated the patient.  I personally confirmed the key portions of the history and exam documented by Dr. Khan and I reviewed pertinent patient test results.  The assessment, diagnosis, and plan were formulated together and I agree with the documentation in the resident's note.  

## 2021-08-28 ENCOUNTER — Other Ambulatory Visit (HOSPITAL_COMMUNITY): Payer: Self-pay

## 2021-08-28 ENCOUNTER — Other Ambulatory Visit: Payer: Self-pay | Admitting: Internal Medicine

## 2021-08-28 DIAGNOSIS — I2510 Atherosclerotic heart disease of native coronary artery without angina pectoris: Secondary | ICD-10-CM

## 2021-08-29 ENCOUNTER — Other Ambulatory Visit (HOSPITAL_COMMUNITY): Payer: Self-pay

## 2021-08-29 MED ORDER — ISOSORBIDE DINITRATE 30 MG PO TABS
30.0000 mg | ORAL_TABLET | Freq: Every day | ORAL | 3 refills | Status: AC
  Filled 2021-08-29: qty 30, 30d supply, fill #0
  Filled 2021-11-23: qty 30, 30d supply, fill #1

## 2021-08-30 ENCOUNTER — Other Ambulatory Visit (HOSPITAL_COMMUNITY): Payer: Self-pay

## 2021-10-25 DIAGNOSIS — I25118 Atherosclerotic heart disease of native coronary artery with other forms of angina pectoris: Secondary | ICD-10-CM | POA: Diagnosis not present

## 2021-10-25 DIAGNOSIS — Z87828 Personal history of other (healed) physical injury and trauma: Secondary | ICD-10-CM | POA: Diagnosis not present

## 2021-10-25 DIAGNOSIS — Z1159 Encounter for screening for other viral diseases: Secondary | ICD-10-CM | POA: Diagnosis not present

## 2021-10-25 DIAGNOSIS — E669 Obesity, unspecified: Secondary | ICD-10-CM | POA: Diagnosis not present

## 2021-10-25 DIAGNOSIS — I10 Essential (primary) hypertension: Secondary | ICD-10-CM | POA: Diagnosis not present

## 2021-10-25 DIAGNOSIS — E119 Type 2 diabetes mellitus without complications: Secondary | ICD-10-CM | POA: Diagnosis not present

## 2021-10-25 DIAGNOSIS — Z79899 Other long term (current) drug therapy: Secondary | ICD-10-CM | POA: Diagnosis not present

## 2021-10-25 DIAGNOSIS — Z0001 Encounter for general adult medical examination with abnormal findings: Secondary | ICD-10-CM | POA: Diagnosis not present

## 2021-10-25 DIAGNOSIS — E785 Hyperlipidemia, unspecified: Secondary | ICD-10-CM | POA: Diagnosis not present

## 2021-10-25 DIAGNOSIS — Z7189 Other specified counseling: Secondary | ICD-10-CM | POA: Diagnosis not present
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
## 2021-11-14 ENCOUNTER — Encounter: Payer: Self-pay | Admitting: Interventional Cardiology

## 2021-11-14 ENCOUNTER — Other Ambulatory Visit: Payer: Self-pay

## 2021-11-14 ENCOUNTER — Ambulatory Visit (INDEPENDENT_AMBULATORY_CARE_PROVIDER_SITE_OTHER): Payer: Medicare HMO | Admitting: Interventional Cardiology

## 2021-11-14 ENCOUNTER — Encounter: Payer: Self-pay | Admitting: *Deleted

## 2021-11-14 VITALS — BP 126/88 | HR 76 | Ht 63.0 in | Wt 173.0 lb

## 2021-11-14 DIAGNOSIS — E782 Mixed hyperlipidemia: Secondary | ICD-10-CM

## 2021-11-14 DIAGNOSIS — I252 Old myocardial infarction: Secondary | ICD-10-CM | POA: Diagnosis not present

## 2021-11-14 DIAGNOSIS — I10 Essential (primary) hypertension: Secondary | ICD-10-CM | POA: Diagnosis not present

## 2021-11-14 DIAGNOSIS — I25118 Atherosclerotic heart disease of native coronary artery with other forms of angina pectoris: Secondary | ICD-10-CM | POA: Diagnosis not present

## 2021-11-14 NOTE — Patient Instructions (Signed)
Medication Instructions:  °Your physician recommends that you continue on your current medications as directed. Please refer to the Current Medication list given to you today. ° °*If you need a refill on your cardiac medications before your next appointment, please call your pharmacy* ° ° °Lab Work: °none °If you have labs (blood work) drawn today and your tests are completely normal, you will receive your results only by: °MyChart Message (if you have MyChart) OR °A paper copy in the mail °If you have any lab test that is abnormal or we need to change your treatment, we will call you to review the results. ° ° °Testing/Procedures: °Your physician has requested that you have a lexiscan myoview. For further information please visit [email or web address]. Please follow instruction sheet, as given. ° ° ° °Follow-Up: °At [facility], you and your health needs are our priority.  As part of our continuing mission to provide you with exceptional heart care, we have created designated Provider Care Teams.  These Care Teams include your primary Cardiologist (physician) and Advanced Practice Providers (APPs -  Physician Assistants and Nurse Practitioners) who all work together to provide you with the care you need, when you need it. ° °We recommend signing up for the patient portal called "MyChart".  Sign up information is provided on this After Visit Summary.  MyChart is used to connect with patients for Virtual Visits (Telemedicine).  Patients are able to view lab/test results, encounter notes, upcoming appointments, etc.  Non-urgent messages can be sent to your provider as well.   °To learn more about what you can do with MyChart, go to [email or web address].   ° °Your next appointment:   °12 month(s) ° °The format for your next appointment:   °In Person ° °Provider:   °Jayadeep Varanasi, MD   ° ° °Other Instructions ° ° °

## 2021-11-19 ENCOUNTER — Telehealth (HOSPITAL_COMMUNITY): Payer: Self-pay

## 2021-11-19 NOTE — Telephone Encounter (Signed)
Spoke with the patient, detailed instructions given. She stated that she would be here for her test. Asked to call back with any questions. S.Shala Baumbach EMTP 

## 2021-11-22 ENCOUNTER — Other Ambulatory Visit: Payer: Self-pay

## 2021-11-22 ENCOUNTER — Ambulatory Visit (HOSPITAL_COMMUNITY): Payer: Medicare HMO | Attending: Interventional Cardiology

## 2021-11-22 DIAGNOSIS — I25118 Atherosclerotic heart disease of native coronary artery with other forms of angina pectoris: Secondary | ICD-10-CM | POA: Diagnosis present

## 2021-11-22 LAB — MYOCARDIAL PERFUSION IMAGING
LV dias vol: 63 mL (ref 46–106)
LV sys vol: 25 mL
Nuc Stress EF: 60 %
Peak HR: 95 {beats}/min
Rest HR: 77 {beats}/min
Rest Nuclear Isotope Dose: 10.4 mCi
SDS: 3
SRS: 13
SSS: 18
ST Depression (mm): 0 mm
Stress Nuclear Isotope Dose: 30.9 mCi
TID: 0.92

## 2021-11-22 MED ORDER — REGADENOSON 0.4 MG/5ML IV SOLN
0.4000 mg | Freq: Once | INTRAVENOUS | Status: AC
  Administered 2021-11-22: 0.4 mg via INTRAVENOUS

## 2021-11-22 MED ORDER — TECHNETIUM TC 99M TETROFOSMIN IV KIT
30.9000 | PACK | Freq: Once | INTRAVENOUS | Status: AC | PRN
  Administered 2021-11-22: 30.9 via INTRAVENOUS
  Filled 2021-11-22: qty 31

## 2021-11-22 MED ORDER — TECHNETIUM TC 99M TETROFOSMIN IV KIT
10.4000 | PACK | Freq: Once | INTRAVENOUS | Status: AC | PRN
  Administered 2021-11-22: 10.4 via INTRAVENOUS
  Filled 2021-11-22: qty 11

## 2021-11-23 ENCOUNTER — Other Ambulatory Visit (HOSPITAL_COMMUNITY): Payer: Self-pay

## 2022-02-27 ENCOUNTER — Other Ambulatory Visit: Payer: Self-pay | Admitting: Student

## 2022-02-27 DIAGNOSIS — Z1231 Encounter for screening mammogram for malignant neoplasm of breast: Secondary | ICD-10-CM

## 2022-03-05 ENCOUNTER — Ambulatory Visit
Admission: RE | Admit: 2022-03-05 | Discharge: 2022-03-05 | Disposition: A | Discharge status: Home / Self Care | Payer: Medicare HMO | Source: Ambulatory Visit | Attending: Student | Admitting: Student

## 2022-03-05 DIAGNOSIS — Z1231 Encounter for screening mammogram for malignant neoplasm of breast: Secondary | ICD-10-CM

## 2022-06-17 ENCOUNTER — Encounter: Payer: Self-pay | Admitting: *Deleted

## 2022-06-26 NOTE — Progress Notes (Unsigned)
   CC: HTN follow up  HPI:  [name] is a 71 y.o. with medical history of HTN, HLD, CAD s/p stent placement presenting to [facility] for follow up.   Please see problem-based list for further details, assessments, and plans.  Past Medical History:  Diagnosis Date   CAD (coronary artery disease), native coronary artery 04/25/2017   Hx of stent placement in [place].   Diabetes mellitus without complication (HCC)    Hypercholesteremia    Hypertension      Current Outpatient Medications (Cardiovascular):    amLODipine-olmesartan (AZOR) 10-40 MG tablet, Take 1 tablet by mouth daily.   carvedilol (COREG) 25 MG tablet, Take 1 tablet (25 mg total) by mouth 2 (two) times daily.   hydrochlorothiazide (HYDRODIURIL) 12.5 MG tablet, Take 1 tablet (12.5 mg total) by mouth daily.   isosorbide dinitrate (ISORDIL) 30 MG tablet, Take 1 tablet (30 mg total) by mouth daily.   nitroGLYCERIN (NITROSTAT) 0.4 MG SL tablet, Place 1 tablet (0.4 mg total) under the tongue every 5 (five) minutes as needed for chest pain.   rosuvastatin (CRESTOR) 40 MG tablet, Take 1 tablet (40 mg total) by mouth daily.    Current Outpatient Medications (Hematological):    clopidogrel (PLAVIX) 75 MG tablet, Take 1 tablet (75 mg total) by mouth daily.   ferrous sulfate 325 (65 FE) MG tablet, Take 1 tablet (325 mg total) by mouth daily.   folic acid (FOLVITE) 1 MG tablet, TAKE 1 TABLET BY MOUTH EVERY DAY  Current Outpatient Medications (Other):    diclofenac Sodium (VOLTAREN) 1 % GEL, Apply 2 g topically 4 (four) times daily.  Review of Systems:  Review of system negative unless stated in the problem list or HPI.    Physical Exam:  Vitals:   06/27/22 1045  BP: 120/81  Pulse: 71  Resp: (!) 24  Temp: 98.2 F (36.8 C)  TempSrc: Oral  SpO2: 100%  Weight: 178 lb (80.7 kg)  Height: 5\' 3"  (1.6 m)    Physical Exam General: NAD HENT: NCAT Lungs: CTAB, no wheeze, rhonchi or rales.  Cardiovascular: Normal heart sounds,  no r/m/g, 2+ pulses in all extremities. No LE edema Abdomen: No TTP, normal bowel sounds MSK: No asymmetry or muscle atrophy.  Skin: no lesions noted on exposed skin Neuro: Alert and oriented x4. CN grossly intact Psych: Normal mood and normal affect   Assessment & Plan:   No problem-specific Assessment & Plan notes found for this encounter.   See Encounters Tab for problem based charting.  Patient discussed with Dr. , MD Thersa Salt. [facility] Internal Medicine Residency, PGY-2  DMII Had A1c few weeks ago at [facility]. Takes only Metformin 1000 mg qd.   HLD Cholesterol check done few weeks ago.

## 2022-06-27 ENCOUNTER — Other Ambulatory Visit: Payer: Self-pay

## 2022-06-27 ENCOUNTER — Encounter: Payer: Self-pay | Admitting: Internal Medicine

## 2022-06-27 ENCOUNTER — Ambulatory Visit (INDEPENDENT_AMBULATORY_CARE_PROVIDER_SITE_OTHER): Payer: Medicare (Managed Care) | Admitting: Internal Medicine

## 2022-06-27 VITALS — BP 120/81 | HR 71 | Temp 98.2°F | Resp 24 | Ht 63.0 in | Wt 178.0 lb

## 2022-06-27 DIAGNOSIS — E7849 Other hyperlipidemia: Secondary | ICD-10-CM | POA: Diagnosis not present

## 2022-06-27 DIAGNOSIS — Z7984 Long term (current) use of oral hypoglycemic drugs: Secondary | ICD-10-CM | POA: Diagnosis not present

## 2022-06-27 DIAGNOSIS — I10 Essential (primary) hypertension: Secondary | ICD-10-CM | POA: Diagnosis not present

## 2022-06-27 DIAGNOSIS — Z23 Encounter for immunization: Secondary | ICD-10-CM | POA: Diagnosis not present

## 2022-06-27 DIAGNOSIS — E119 Type 2 diabetes mellitus without complications: Secondary | ICD-10-CM | POA: Diagnosis not present

## 2022-06-27 DIAGNOSIS — Z0000 Encounter for general adult medical examination without abnormal findings: Secondary | ICD-10-CM

## 2022-06-27 DIAGNOSIS — Z87891 Personal history of nicotine dependence: Secondary | ICD-10-CM

## 2022-06-27 MED ORDER — METFORMIN HCL ER 500 MG PO TB24: 1000.0000 mg | ORAL_TABLET | Freq: Every day | ORAL | 11 refills | Status: AC

## 2022-06-27 NOTE — Patient Instructions (Addendum)
[name], it was a pleasure seeing you today! You endorsed feeling well today. Below are some of the things we talked about this visit. We look forward to seeing you in the follow up appointment!  Today we discussed: You are doing well. Continue taking all your medications. Since you have started seeing [facility] and you can not have two primary care doctors. We are unable to see you. We are sad to see you leave this clinic but wish you the best of luck.   We gave you a flu shot today.   I have ordered the following labs today:  Lab Orders  No laboratory test(s) ordered today      Referrals ordered today:   Referral Orders  No referral(s) requested today     I have ordered the following medication/changed the following medications:   Stop the following medications: There are no discontinued medications.   Start the following medications: Meds ordered this encounter  Medications   metFORMIN (GLUCOPHAGE-XR) 500 MG 24 hr tablet    Sig: Take 2 tablets (1,000 mg total) by mouth daily with breakfast.    Dispense:  60 tablet    Refill:  11     Follow-up: Follow up with [facility]  Please make sure to arrive 15 minutes prior to your next appointment. If you arrive late, you may be asked to reschedule.   We look forward to seeing you next time. Please call our clinic at [phone] if you have any questions or concerns. The best time to call is Monday-Friday from 9am-4pm, but there is someone available 24/7. If after hours or the weekend, call the main hospital number and ask for the Internal Medicine Resident On-Call. If you need medication refills, please notify your pharmacy one week in advance and they will send [place] a request.  Thank you for letting us take part in your care. Wishing you the best!  Thank you, Gwenevere Abbot, MD

## 2022-06-29 NOTE — Assessment & Plan Note (Signed)
Patient has DMII and was managed with life style modifications. She is on Metformin and plan was for A1c but that has been performed by another provider so will defer repeating this test. Currently not endorsing any symptoms of polydipsia, or polyuria.

## 2022-06-29 NOTE — Assessment & Plan Note (Signed)
Well controlled on previous regimen. Plan to continue to current therapy. Patient started to be seen at [facility] and advised pt to follow with one PCP only. Patient understanding.

## 2022-06-29 NOTE — Assessment & Plan Note (Signed)
Continue current therapy. States lipid panel was done recently as well.

## 2022-07-08 NOTE — Progress Notes (Signed)
Internal Medicine Clinic Attending  Case discussed with Dr. Khan  At the time of the visit.  We reviewed the resident's history and exam and pertinent patient test results.  I agree with the assessment, diagnosis, and plan of care documented in the resident's note.  

## 2022-07-13 ENCOUNTER — Encounter (HOSPITAL_COMMUNITY): Payer: Self-pay

## 2022-07-13 ENCOUNTER — Emergency Department (HOSPITAL_COMMUNITY)
Admission: EM | Admit: 2022-07-13 | Discharge: 2022-07-13 | Disposition: A | Discharge status: Home / Self Care | Payer: Medicare (Managed Care) | Attending: Emergency Medicine | Admitting: Emergency Medicine

## 2022-07-13 DIAGNOSIS — Z7984 Long term (current) use of oral hypoglycemic drugs: Secondary | ICD-10-CM | POA: Diagnosis not present

## 2022-07-13 DIAGNOSIS — H1031 Unspecified acute conjunctivitis, right eye: Secondary | ICD-10-CM | POA: Diagnosis not present

## 2022-07-13 DIAGNOSIS — I10 Essential (primary) hypertension: Secondary | ICD-10-CM | POA: Diagnosis not present

## 2022-07-13 DIAGNOSIS — E119 Type 2 diabetes mellitus without complications: Secondary | ICD-10-CM | POA: Insufficient documentation

## 2022-07-13 DIAGNOSIS — Z7902 Long term (current) use of antithrombotics/antiplatelets: Secondary | ICD-10-CM | POA: Insufficient documentation

## 2022-07-13 DIAGNOSIS — Z79899 Other long term (current) drug therapy: Secondary | ICD-10-CM | POA: Insufficient documentation

## 2022-07-13 DIAGNOSIS — I2510 Atherosclerotic heart disease of native coronary artery without angina pectoris: Secondary | ICD-10-CM | POA: Insufficient documentation

## 2022-07-13 DIAGNOSIS — H579 Unspecified disorder of eye and adnexa: Secondary | ICD-10-CM | POA: Diagnosis present

## 2022-07-13 MED ORDER — ERYTHROMYCIN 5 MG/GM OP OINT: TOPICAL_OINTMENT | OPHTHALMIC | 0 refills | Status: AC

## 2022-07-13 NOTE — ED Provider Notes (Signed)
[facility] EMERGENCY DEPARTMENT Provider Note   CSN: [id number] Arrival date & time: 07/13/22  1816     History  Chief Complaint  Patient presents with   Conjunctivitis    Carrie Stevenson is a 71 y.o. female with a past medical history significant for diabetes, hypertension, CAD, and iron deficiency anemia who presents to the ED due to right eye redness and irritation that started earlier today.  Patient admits to drainage from right eye.  No visual changes.  Denies fever and chills.  No injury to right eye.  No pain with EOMs.  Denies runny nose, cough, or sore throat.  No otalgia.  History obtained from patient and past medical records. No interpreter used during encounter.       Home Medications Prior to Admission medications   Medication Sig Start Date End Date Taking? Authorizing Provider  erythromycin ophthalmic ointment Place a 1/2 inch ribbon of ointment into the lower eyelid. 07/13/22  Yes Reynalda Canny, Druscilla Brownie, PA-C  amLODipine-olmesartan (AZOR) 10-40 MG tablet Take 1 tablet by mouth daily. 06/26/21   Idamae Schuller, MD  carvedilol (COREG) 25 MG tablet Take 1 tablet (25 mg total) by mouth 2 (two) times daily. 02/26/21   [name], MD  clopidogrel (PLAVIX) 75 MG tablet Take 1 tablet (75 mg total) by mouth daily. 05/11/21   Jettie Booze, MD  diclofenac Sodium (VOLTAREN) 1 % GEL Apply 2 g topically 4 (four) times daily. 05/11/21   Tacy Learn, PA-C  ferrous sulfate 325 (65 FE) MG tablet Take 1 tablet (325 mg total) by mouth daily. 02/26/21   [name], MD  folic acid (FOLVITE) 1 MG tablet TAKE 1 TABLET BY MOUTH EVERY DAY 06/05/20   [name], MD  hydrochlorothiazide (HYDRODIURIL) 12.5 MG tablet Take 1 tablet (12.5 mg total) by mouth daily. 02/26/21   [name], MD  isosorbide dinitrate (ISORDIL) 30 MG tablet Take 1 tablet (30 mg total) by mouth daily. 08/29/21   Sanjuan Dame, MD  metFORMIN (GLUCOPHAGE-XR) 500 MG 24 hr  tablet Take 2 tablets (1,000 mg total) by mouth daily with breakfast. 06/27/22 06/27/23  Idamae Schuller, MD  nitroGLYCERIN (NITROSTAT) 0.4 MG SL tablet Place 1 tablet (0.4 mg total) under the tongue every 5 (five) minutes as needed for chest pain. 05/11/21   Jettie Booze, MD  rosuvastatin (CRESTOR) 40 MG tablet Take 1 tablet (40 mg total) by mouth daily. 02/26/21   [name], MD      Allergies    Patient has no known allergies.    Review of Systems   Review of Systems  Constitutional:  Negative for chills and fever.  Eyes:  Positive for pain, discharge, redness and itching. Negative for visual disturbance.    Physical Exam Updated Vital Signs BP (!) 140/88   Pulse 86   Temp 98.8 F (37.1 C) (Oral)   Resp 18   LMP  (LMP Unknown)   SpO2 95%  Physical Exam Vitals and nursing note reviewed.  Constitutional:      General: She is not in acute distress.    Appearance: She is not ill-appearing.  HENT:     Head: Normocephalic.  Eyes:     General:        Right eye: Discharge present.     Extraocular Movements: Extraocular movements intact.     Pupils: Pupils are equal, round, and reactive to light.     Comments: Injected conjunctivae of right eye.  No ciliary flush. EOMs intact without pain.   Cardiovascular:     Rate and Rhythm: Normal rate and regular rhythm.     Pulses: Normal pulses.     Heart sounds: Normal heart sounds. No murmur heard.    No friction rub. No gallop.  Pulmonary:     Effort: Pulmonary effort is normal.     Breath sounds: Normal breath sounds.  Abdominal:     General: Abdomen is flat. There is no distension.     Palpations: Abdomen is soft.     Tenderness: There is no abdominal tenderness. There is no guarding or rebound.  Musculoskeletal:        General: Normal range of motion.     Cervical back: Neck supple.  Skin:    General: Skin is warm and dry.  Neurological:     General: No focal deficit present.     Mental Status: She is alert.   Psychiatric:        Mood and Affect: Mood normal.        Behavior: Behavior normal.     ED Results / Procedures / Treatments   Labs (all labs ordered are listed, but only abnormal results are displayed) Labs Reviewed - No data to display  EKG None  Radiology No results found.  Procedures Procedures    Medications Ordered in ED Medications - No data to display  ED Course/ Medical Decision Making/ A&P                           Medical Decision Making Amount and/or Complexity of Data Reviewed Independent Historian:     Details: Son at bedside provided some history   71 year old female presents to the ED due to right eye irritation and redness. History of DM.  She also endorses drainage.  Physical exam consistent with bacterial conjunctivitis.  Normal visual acuity.  EOMs intact without pain. Purulent discharge exam.  Low suspicion for corneal abrasions, entrapment, or HSV.  No evidence of preseptal or orbital cellulitis.  Pt is not a contact lens wearer.  Patient will be given erythromycin ophthalmic.  Personal hygiene and frequent handwashing discussed. Strict ED precautions discussed with patient. Patient states understanding and agrees to plan. Patient discharged home in no acute distress and stable vitals.        Final Clinical Impression(s) / ED Diagnoses Final diagnoses:  Acute bacterial conjunctivitis of right eye    Rx / DC Orders ED Discharge Orders          Ordered    erythromycin ophthalmic ointment        07/13/22 2155              Suzy Bouchard, PA-C 07/13/22 2156    Gareth Morgan, MD 07/15/22 2231

## 2022-07-13 NOTE — Discharge Instructions (Addendum)
It was a pleasure taking care of you today.  As discussed, it appears you have pink eye.  I am sending you home with an ointment.  Use as prescribed.  Follow-up with PCP if symptoms do not improve over the next few days.  Continue to wash your hands.  Return to the ER for new or worsening symptoms.

## 2022-07-13 NOTE — ED Triage Notes (Signed)
Pt states that over the past few hours she has been having redness and itching to the R eye with some drainage.

## 2022-10-21 DEATH — deceased

## 2022-12-25 ENCOUNTER — Telehealth: Payer: Self-pay | Admitting: Dietician

## 2022-12-25 NOTE — Telephone Encounter (Signed)
Calling to see if patient would like to continue care at our office:no answer.unable to leave a message.

## 2023-01-30 IMAGING — MG MM DIGITAL SCREENING BILAT W/ TOMO AND CAD
8 series · 8 of 24 positions shown · non-contrast
Comparison: Previous exam(s).

CLINICAL DATA: Screening.

EXAM:
DIGITAL SCREENING BILATERAL MAMMOGRAM WITH TOMOSYNTHESIS AND CAD
TECHNIQUE: Bilateral screening digital craniocaudal and mediolateral oblique
mammograms were obtained. Bilateral screening digital breast
tomosynthesis was performed. The images were evaluated with
computer-aided detection.

[L CC synth-2D]
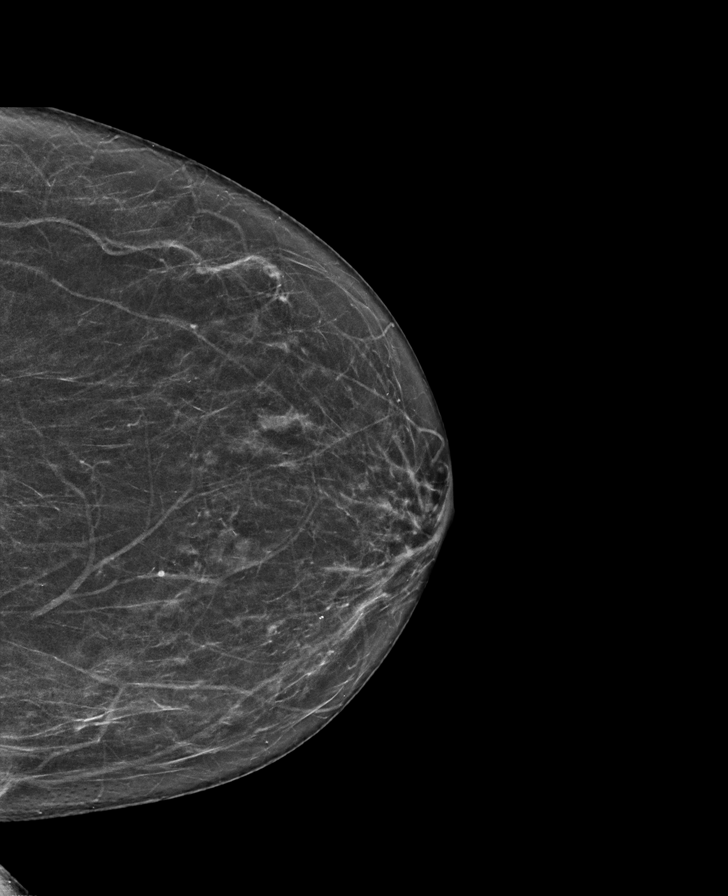

[L MLO synth-2D]
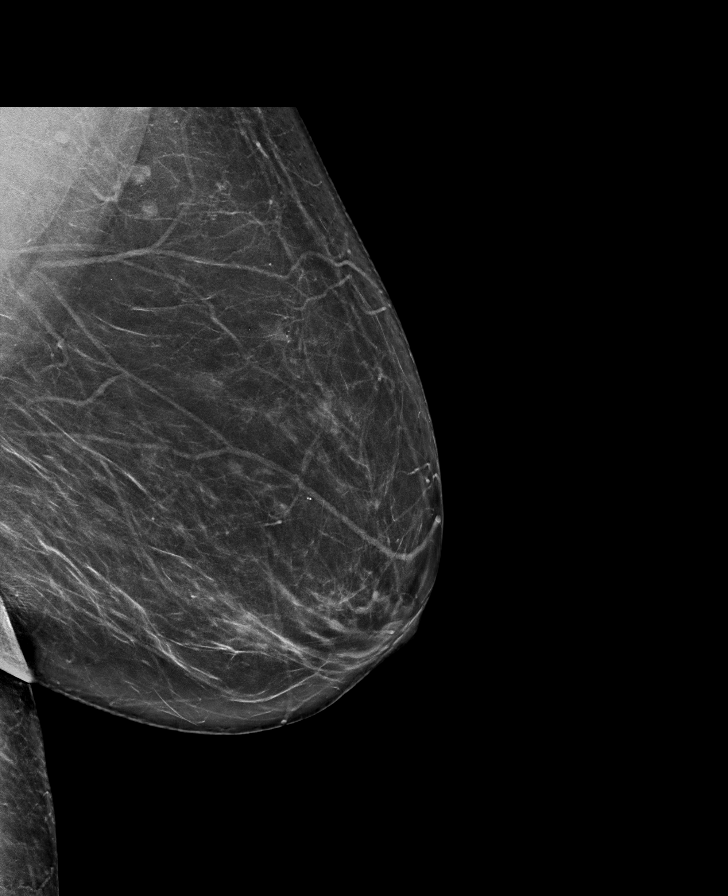

[R MLO synth-2D]
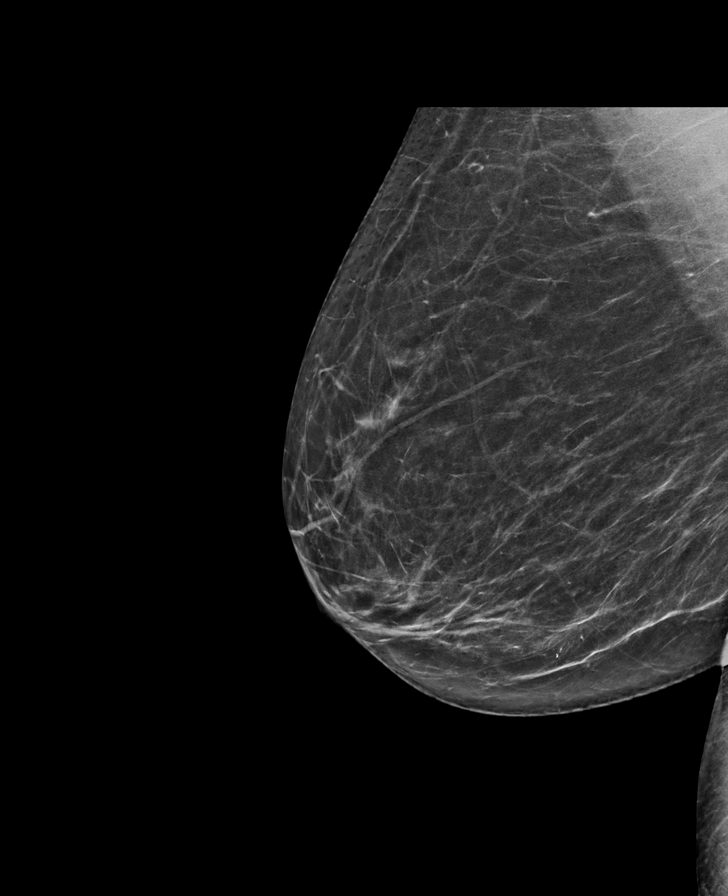

[R CC synth-2D]
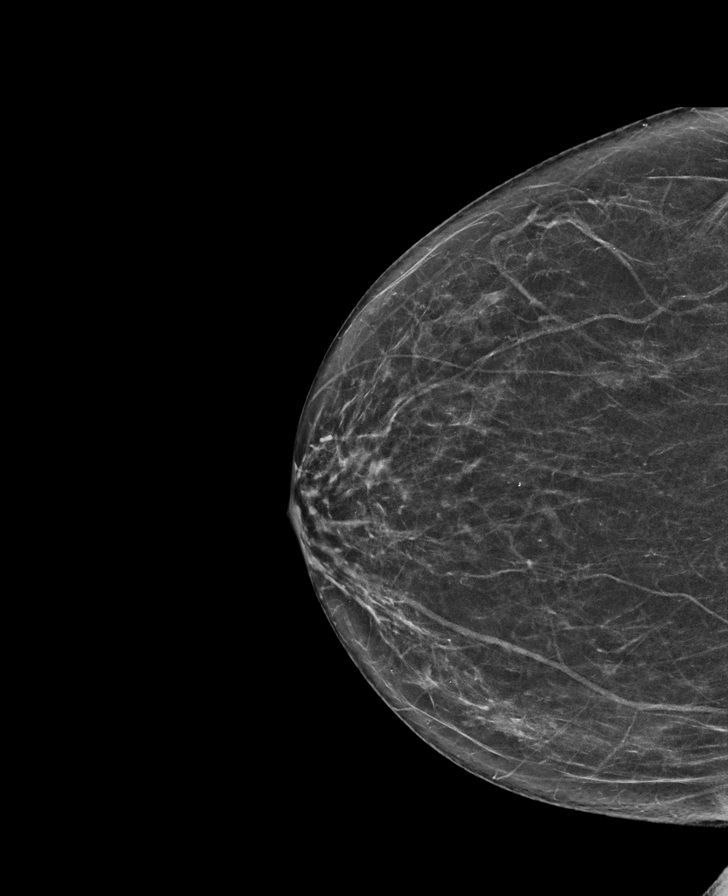

[R MLO tomo · tomo slice 39/76.0]
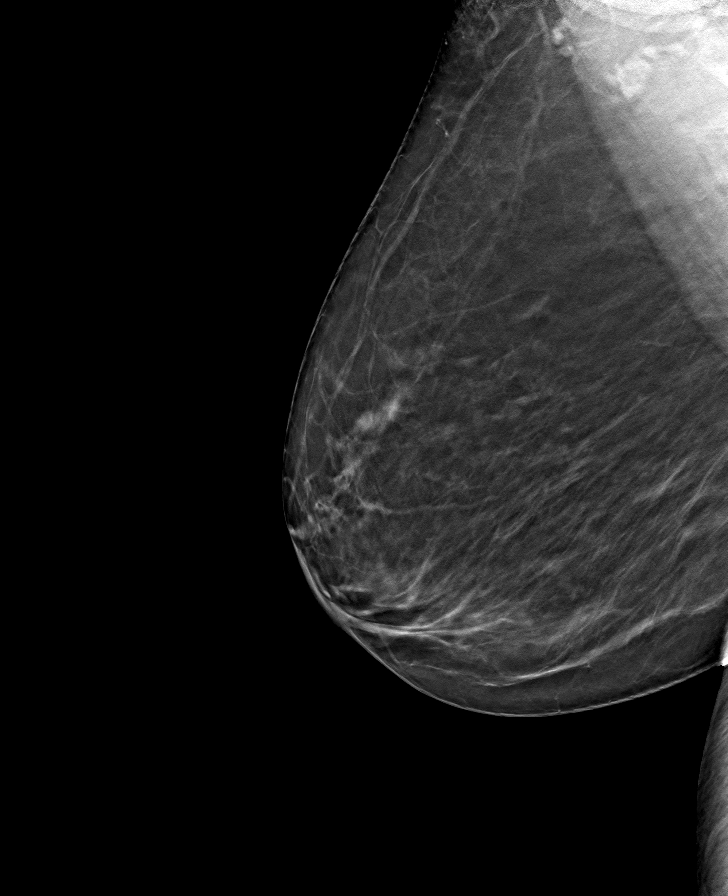

[R CC tomo · tomo slice 33/66.0]
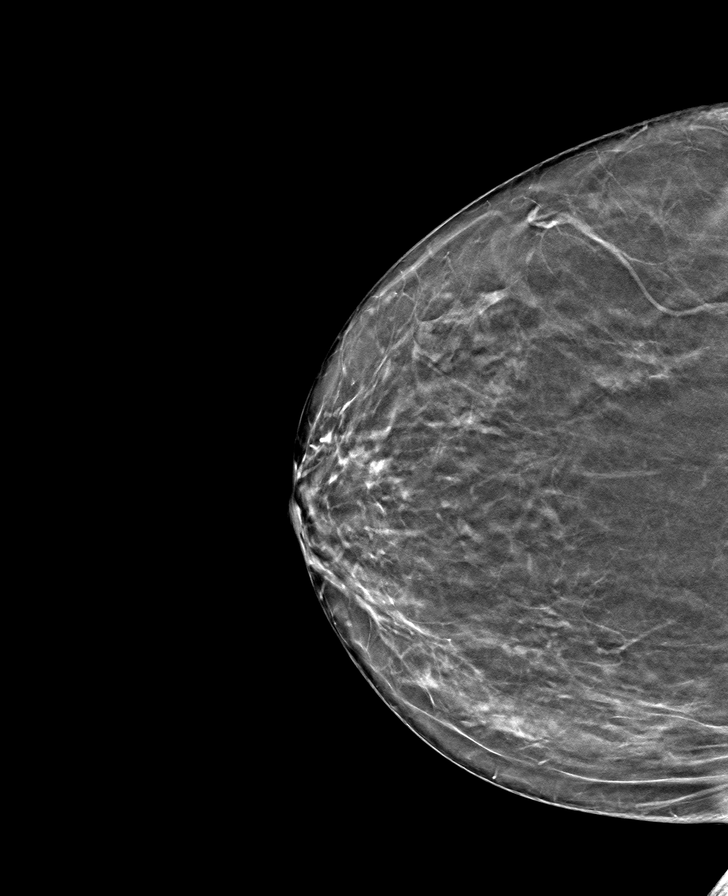

[L MLO tomo · tomo slice 39/78.0]
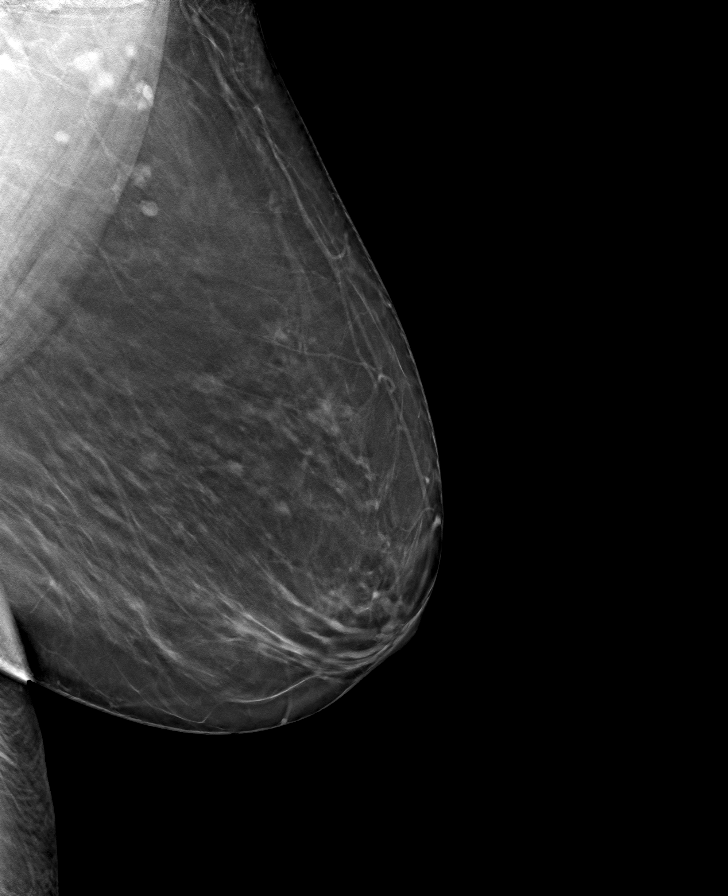

[L CC tomo · tomo slice 33/64.0]
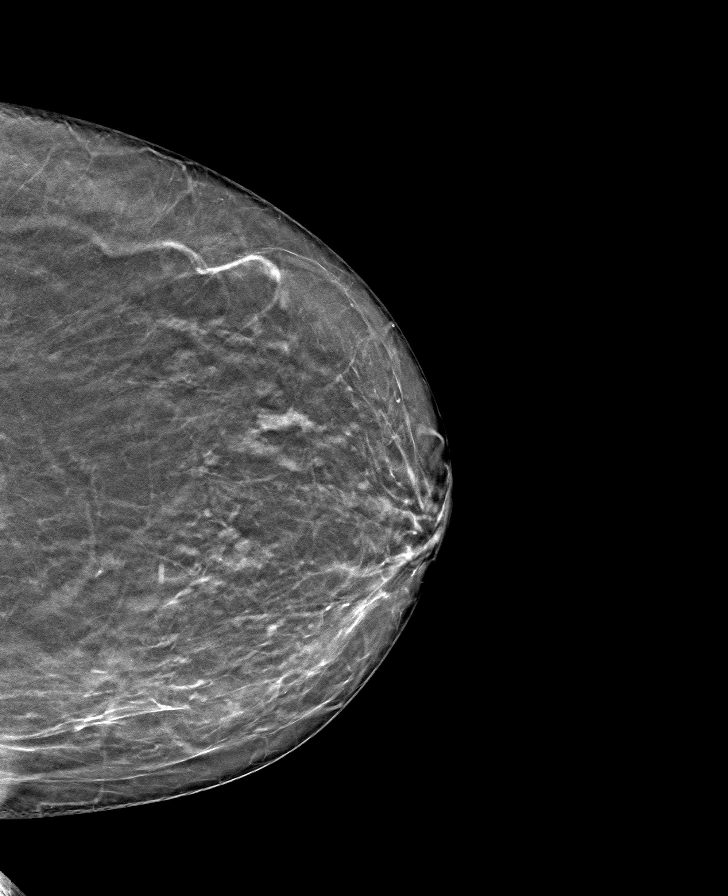

[8 of 24 positions shown; findings below may reference images not displayed]

ACR Breast Density Category b: There are scattered areas of
fibroglandular density.
FINDINGS: There are no findings suspicious for malignancy.
IMPRESSION: No mammographic evidence of malignancy. A result letter of this
screening mammogram will be mailed directly to the patient.

RECOMMENDATION:
Screening mammogram in one year. (Code:51-O-LD2)

BI-RADS CATEGORY  1: Negative.
